# Patient Record
Sex: Female | Born: 1990 | Race: Black or African American | Hispanic: No | Marital: Single | Smoking: Never smoker
Health system: Southern US, Community
[De-identification: ages and names within clinical notes are randomized; demographics above are authoritative.]

## PROBLEM LIST (undated history)

## (undated) ENCOUNTER — Inpatient Hospital Stay (HOSPITAL_COMMUNITY): Payer: Self-pay

## (undated) DIAGNOSIS — D649 Anemia, unspecified: Secondary | ICD-10-CM

---
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
## 2015-06-12 LAB — OB RESULTS CONSOLE RUBELLA ANTIBODY, IGM: Rubella: IMMUNE

## 2015-06-12 LAB — OB RESULTS CONSOLE HIV ANTIBODY (ROUTINE TESTING): HIV: NONREACTIVE

## 2015-06-12 LAB — OB RESULTS CONSOLE HEPATITIS B SURFACE ANTIGEN: Hepatitis B Surface Ag: NEGATIVE

## 2015-06-23 ENCOUNTER — Inpatient Hospital Stay (HOSPITAL_COMMUNITY)
Admission: AD | Admit: 2015-06-23 | Discharge: 2015-06-24 | Disposition: A | Discharge status: Home / Self Care | Payer: Medicaid Other | Source: Ambulatory Visit | Attending: Obstetrics and Gynecology | Admitting: Obstetrics and Gynecology

## 2015-06-23 ENCOUNTER — Encounter (HOSPITAL_COMMUNITY): Payer: Self-pay | Admitting: *Deleted

## 2015-06-23 DIAGNOSIS — O99891 Other specified diseases and conditions complicating pregnancy: Secondary | ICD-10-CM

## 2015-06-23 DIAGNOSIS — O99611 Diseases of the digestive system complicating pregnancy, first trimester: Secondary | ICD-10-CM | POA: Diagnosis not present

## 2015-06-23 DIAGNOSIS — Z87891 Personal history of nicotine dependence: Secondary | ICD-10-CM | POA: Insufficient documentation

## 2015-06-23 DIAGNOSIS — M549 Dorsalgia, unspecified: Secondary | ICD-10-CM | POA: Diagnosis present

## 2015-06-23 DIAGNOSIS — K219 Gastro-esophageal reflux disease without esophagitis: Secondary | ICD-10-CM | POA: Diagnosis not present

## 2015-06-23 DIAGNOSIS — O26899 Other specified pregnancy related conditions, unspecified trimester: Secondary | ICD-10-CM

## 2015-06-23 DIAGNOSIS — R109 Unspecified abdominal pain: Secondary | ICD-10-CM | POA: Diagnosis not present

## 2015-06-23 DIAGNOSIS — O9989 Other specified diseases and conditions complicating pregnancy, childbirth and the puerperium: Secondary | ICD-10-CM

## 2015-06-23 DIAGNOSIS — Z3A13 13 weeks gestation of pregnancy: Secondary | ICD-10-CM | POA: Diagnosis not present

## 2015-06-23 LAB — CBC WITH DIFFERENTIAL/PLATELET
Basophils Absolute: 0 10*3/uL (ref 0.0–0.1)
Basophils Relative: 0 % (ref 0–1)
Eosinophils Absolute: 0.3 10*3/uL (ref 0.0–0.7)
Eosinophils Relative: 2 % (ref 0–5)
HCT: 33.5 % — ABNORMAL LOW (ref 36.0–46.0)
HEMOGLOBIN: 11.5 g/dL — AB (ref 12.0–15.0)
LYMPHS ABS: 2.1 10*3/uL (ref 0.7–4.0)
LYMPHS PCT: 16 % (ref 12–46)
MCH: 30.4 pg (ref 26.0–34.0)
MCHC: 34.3 g/dL (ref 30.0–36.0)
MCV: 88.6 fL (ref 78.0–100.0)
Monocytes Absolute: 0.7 10*3/uL (ref 0.1–1.0)
Monocytes Relative: 6 % (ref 3–12)
NEUTROS PCT: 76 % (ref 43–77)
Neutro Abs: 9.9 10*3/uL — ABNORMAL HIGH (ref 1.7–7.7)
PLATELETS: 312 10*3/uL (ref 150–400)
RBC: 3.78 MIL/uL — AB (ref 3.87–5.11)
RDW: 12.2 % (ref 11.5–15.5)
WBC: 13 10*3/uL — AB (ref 4.0–10.5)

## 2015-06-23 LAB — COMPREHENSIVE METABOLIC PANEL
ALT: 19 U/L (ref 14–54)
AST: 20 U/L (ref 15–41)
Albumin: 3.6 g/dL (ref 3.5–5.0)
Alkaline Phosphatase: 35 U/L — ABNORMAL LOW (ref 38–126)
Anion gap: 3 — ABNORMAL LOW (ref 5–15)
BILIRUBIN TOTAL: 0.1 mg/dL — AB (ref 0.3–1.2)
BUN: 8 mg/dL (ref 6–20)
CHLORIDE: 113 mmol/L — AB (ref 101–111)
CO2: 22 mmol/L (ref 22–32)
CREATININE: 0.53 mg/dL (ref 0.44–1.00)
Calcium: 8.8 mg/dL — ABNORMAL LOW (ref 8.9–10.3)
Glucose, Bld: 107 mg/dL — ABNORMAL HIGH (ref 65–99)
Potassium: 3.5 mmol/L (ref 3.5–5.1)
Sodium: 138 mmol/L (ref 135–145)
Total Protein: 6.7 g/dL (ref 6.5–8.1)

## 2015-06-23 LAB — URINALYSIS, ROUTINE W REFLEX MICROSCOPIC
BILIRUBIN URINE: NEGATIVE
Glucose, UA: NEGATIVE mg/dL
HGB URINE DIPSTICK: NEGATIVE
KETONES UR: NEGATIVE mg/dL
Leukocytes, UA: NEGATIVE
NITRITE: NEGATIVE
PH: 6 (ref 5.0–8.0)
Protein, ur: NEGATIVE mg/dL
Specific Gravity, Urine: 1.02 (ref 1.005–1.030)
Urobilinogen, UA: 0.2 mg/dL (ref 0.0–1.0)

## 2015-06-23 LAB — AMYLASE: Amylase: 63 U/L (ref 28–100)

## 2015-06-23 MED ORDER — IBUPROFEN 600 MG PO TABS
600.0000 mg | ORAL_TABLET | Freq: Once | ORAL | Status: AC
  Administered 2015-06-24: 600 mg via ORAL
  Filled 2015-06-23: qty 1

## 2015-06-23 MED ORDER — GI COCKTAIL ~~LOC~~
30.0000 mL | Freq: Once | ORAL | Status: AC
  Administered 2015-06-23: 30 mL via ORAL
  Filled 2015-06-23: qty 30

## 2015-06-23 NOTE — MAU Note (Signed)
PT SAYS UPPER ABD  AND  LEFT SIDE  PAIN STARTED  AT 930PM  .   AND  BACK PAIN STARTED  AT 940PM-   ALL WHILE IN BED.   WAS IN OFFICE - DR Chestine SporeLARK   ON 6-16.    HAD U/S  IN OFFICE. ..  LAST SEX-  2 SAT'S  AGO.

## 2015-06-23 NOTE — MAU Note (Signed)
Pt reports since 2130 she has has left lower abd, mid upper abd, and lower back pain. States it started suddenly and it makes her feel like she is going to vomit. Denies bleeding.

## 2015-06-23 NOTE — MAU Provider Note (Signed)
History     CSN: [id number]  Arrival date and time: 06/23/15 2209   First Provider Initiated Contact with Patient 06/23/15 2249      No chief complaint on file.  HPI  [name] is a 24 y.o. G1P0 at 3575w0d who presents to MAU today with complaint of abdominal and back pain. She states that pain started 1 hour prior to arrival. She had similar pain early last week that resolved spontaneously. She has not taken anything for pain today. She has had some nausea without vomiting, diarrhea or constipation. She denies vaginal bleeding, UTI symptoms, fever or vaginal discharge. She rates her mid to lower back pain at 8/10 now and her upper abdominal pain at 5/10 now. She states back pain is sharp and abdominal pain is cramping. She denies heartburn.   OB History    Gravida Para Term Preterm AB TAB SAB Ectopic Multiple Living   1               History reviewed. No pertinent past medical history.  History reviewed. No pertinent past surgical history.  History reviewed. No pertinent family history.  History  Substance Use Topics  . Smoking status: Former Games developermoker  . Smokeless tobacco: Not on file  . Alcohol Use: No    Allergies:  Allergies  Allergen Reactions  . Other Itching    Allergic  Cats-  reddness   And  swelling  . Watermelon [Citrullus Vulgaris] Itching    ALL MELONS    No prescriptions prior to admission    Review of Systems  Constitutional: Negative for fever and malaise/fatigue.  Gastrointestinal: Positive for abdominal pain. Negative for nausea, vomiting, diarrhea and constipation.  Genitourinary: Negative for dysuria, urgency and frequency.       Neg - vaginal bleeding, discharge  Musculoskeletal: Positive for back pain.   Physical Exam   Blood pressure 119/71, pulse 107, temperature 99.1 F (37.3 C), temperature source Oral, resp. rate 16, height 5\' 2"  (1.575 m), weight 158 lb (71.668 kg), SpO2 100 %.  Physical Exam  Nursing note and vitals  reviewed. Constitutional: She is oriented to person, place, and time. She appears well-developed and well-nourished. No distress.  HENT:  Head: Normocephalic and atraumatic.  Cardiovascular: Normal rate.   Respiratory: Effort normal.  GI: Soft. She exhibits no distension and no mass. There is tenderness (tenderness to palpation of the upper abdomen bilaterally). There is no rebound, no guarding and no CVA tenderness.  Musculoskeletal:       Thoracic back: She exhibits tenderness. She exhibits normal range of motion, no bony tenderness, no swelling, no pain and no spasm.       Lumbar back: She exhibits tenderness. She exhibits no bony tenderness, no swelling, no pain and no spasm.  Neurological: She is alert and oriented to person, place, and time.  Skin: Skin is warm and dry. No erythema.  Psychiatric: She has a normal mood and affect.   Results for orders placed or performed during the hospital encounter of 06/23/15 (from the past 24 hour(s))  Urinalysis, Routine w reflex microscopic ([facility])     Status: Abnormal   Collection Time: 06/23/15 10:50 PM  Result Value Ref Range   Color, Urine YELLOW YELLOW   APPearance CLOUDY (A) CLEAR   Specific Gravity, Urine 1.020 1.005 - 1.030   pH 6.0 5.0 - 8.0   Glucose, UA NEGATIVE NEGATIVE mg/dL   Hgb urine dipstick NEGATIVE NEGATIVE   Bilirubin Urine NEGATIVE NEGATIVE  Ketones, ur NEGATIVE NEGATIVE mg/dL   Protein, ur NEGATIVE NEGATIVE mg/dL   Urobilinogen, UA 0.2 0.0 - 1.0 mg/dL   Nitrite NEGATIVE NEGATIVE   Leukocytes, UA NEGATIVE NEGATIVE  CBC with Differential/Platelet     Status: Abnormal   Collection Time: 06/23/15 11:09 PM  Result Value Ref Range   WBC 13.0 (H) 4.0 - 10.5 K/uL   RBC 3.78 (L) 3.87 - 5.11 MIL/uL   Hemoglobin 11.5 (L) 12.0 - 15.0 g/dL   HCT 16.1 (L) 09.6 - 04.5 %   MCV 88.6 78.0 - 100.0 fL   MCH 30.4 26.0 - 34.0 pg   MCHC 34.3 30.0 - 36.0 g/dL   RDW 40.9 81.1 - 91.4 %   Platelets 312 150 - 400 K/uL    Neutrophils Relative % 76 43 - 77 %   Neutro Abs 9.9 (H) 1.7 - 7.7 K/uL   Lymphocytes Relative 16 12 - 46 %   Lymphs Abs 2.1 0.7 - 4.0 K/uL   Monocytes Relative 6 3 - 12 %   Monocytes Absolute 0.7 0.1 - 1.0 K/uL   Eosinophils Relative 2 0 - 5 %   Eosinophils Absolute 0.3 0.0 - 0.7 K/uL   Basophils Relative 0 0 - 1 %   Basophils Absolute 0.0 0.0 - 0.1 K/uL  Comprehensive metabolic panel     Status: Abnormal   Collection Time: 06/23/15 11:09 PM  Result Value Ref Range   Sodium 138 135 - 145 mmol/L   Potassium 3.5 3.5 - 5.1 mmol/L   Chloride 113 (H) 101 - 111 mmol/L   CO2 22 22 - 32 mmol/L   Glucose, Bld 107 (H) 65 - 99 mg/dL   BUN 8 6 - 20 mg/dL   Creatinine, Ser 7.82 0.44 - 1.00 mg/dL   Calcium 8.8 (L) 8.9 - 10.3 mg/dL   Total Protein 6.7 6.5 - 8.1 g/dL   Albumin 3.6 3.5 - 5.0 g/dL   AST 20 15 - 41 U/L   ALT 19 14 - 54 U/L   Alkaline Phosphatase 35 (L) 38 - 126 U/L   Total Bilirubin 0.1 (L) 0.3 - 1.2 mg/dL   GFR calc non Af Amer >60 >60 mL/min   GFR calc Af Amer >60 >60 mL/min   Anion gap 3 (L) 5 - 15  Amylase     Status: None   Collection Time: 06/23/15 11:09 PM  Result Value Ref Range   Amylase 63 28 - 100 U/L    MAU Course  Procedures None  MDM FHR - 168 bpm with doppler CBC, CMP, Amylase and GI cocktail today Discussed with Dr. Tenny Craw. Advised patient could have ibuprofen here for MSK pain. Also, Prilosec at home for avoid recurrence of pain.  600 mg Ibuprofen given in MAU  Assessment and Plan  A: SIUP at [redacted]w[redacted]d GERD Back pain in pregnancy  P: Discharge home Rx for Prilosec given to patient Diet for heartburn in pregnancy discussed Patient advised to follow-up with [facility] as scheduled for routine prenatal care Patient may return to MAU as needed or if her condition were to change or worsen   Marny Lowenstein, PA-C  06/24/2015, 12:09 AM

## 2015-06-24 DIAGNOSIS — R109 Unspecified abdominal pain: Secondary | ICD-10-CM | POA: Diagnosis not present

## 2015-06-24 DIAGNOSIS — O9989 Other specified diseases and conditions complicating pregnancy, childbirth and the puerperium: Secondary | ICD-10-CM

## 2015-06-24 MED ORDER — OMEPRAZOLE 20 MG PO CPDR: 20.0000 mg | DELAYED_RELEASE_CAPSULE | Freq: Every day | ORAL | Status: DC

## 2015-06-24 NOTE — Discharge Instructions (Signed)
Food Choices for Gastroesophageal Reflux Disease When you have gastroesophageal reflux disease (GERD), the foods you eat and your eating habits are very important. Choosing the right foods can help ease your discomfort.  WHAT GUIDELINES DO I NEED TO FOLLOW?   Choose fruits, vegetables, whole grains, and low-fat dairy products.   Choose low-fat meat, fish, and poultry.  Limit fats such as oils, salad dressings, butter, nuts, and avocado.   Keep a food diary. This helps you identify foods that cause symptoms.   Avoid foods that cause symptoms. These may be different for everyone.   Eat small meals often instead of 3 large meals a day.   Eat your meals slowly, in a place where you are relaxed.   Limit fried foods.   Cook foods using methods other than frying.   Avoid drinking alcohol.   Avoid drinking large amounts of liquids with your meals.   Avoid bending over or lying down until 2-3 hours after eating.  WHAT FOODS ARE NOT RECOMMENDED?  These are some foods and drinks that may make your symptoms worse: Vegetables Tomatoes. Tomato juice. Tomato and spaghetti sauce. Chili peppers. Onion and garlic. Horseradish. Fruits Oranges, grapefruit, and lemon (fruit and juice). Meats High-fat meats, fish, and poultry. This includes hot dogs, ribs, ham, sausage, salami, and bacon. Dairy Whole milk and chocolate milk. Sour cream. Cream. Butter. Ice cream. Cream cheese.  Drinks Coffee and tea. Bubbly (carbonated) drinks or energy drinks. Condiments Hot sauce. Barbecue sauce.  Sweets/Desserts Chocolate and cocoa. Donuts. Peppermint and spearmint. Fats and Oils High-fat foods. This includes [place] fries and potato chips. Other Vinegar. Strong spices. This includes black pepper, white pepper, red pepper, cayenne, curry powder, cloves, ginger, and chili powder. The items listed above may not be a complete list of foods and drinks to avoid. Contact your dietitian for more  information. Document Released: 06/13/2012 Document Revised: 12/18/2013 Document Reviewed: 10/17/2013 [facility] Patient Information 2015 [place], [place]. This information is not intended to replace advice given to you by your health care provider. Make sure you discuss any questions you have with your health care provider. Back Pain in Pregnancy Back pain during pregnancy is common. It happens in about half of all pregnancies. It is important for you and your baby that you remain active during your pregnancy.If you feel that back pain is not allowing you to remain active or sleep well, it is time to see your caregiver. Back pain may be caused by several factors related to changes during your pregnancy.Fortunately, unless you had trouble with your back before your pregnancy, the pain is likely to get better after you deliver. Low back pain usually occurs between the fifth and seventh months of pregnancy. It can, however, happen in the first couple months. Factors that increase the risk of back problems include:   Previous back problems.  Injury to your back.  Having twins or multiple births.  A chronic cough.  Stress.  Job-related repetitive motions.  Muscle or spinal disease in the back.  Family history of back problems, ruptured (herniated) discs, or osteoporosis.  Depression, anxiety, and panic attacks. CAUSES   When you are pregnant, your body produces a hormone called relaxin. This hormonemakes the ligaments connecting the low back and pubic bones more flexible. This flexibility allows the baby to be delivered more easily. When your ligaments are loose, your muscles need to work harder to support your back. Soreness in your back can come from tired muscles. Soreness can also come from  back tissues that are irritated since they are receiving less support.  As the baby grows, it puts pressure on the nerves and blood vessels in your pelvis. This can cause back pain.  As the baby  grows and gets heavier during pregnancy, the uterus pushes the stomach muscles forward and changes your center of gravity. This makes your back muscles work harder to maintain good posture. SYMPTOMS  Lumbar pain during pregnancy Lumbar pain during pregnancy usually occurs at or above the waist in the center of the back. There may be pain and numbness that radiates into your leg or foot. This is similar to low back pain experienced by non-pregnant women. It usually increases with sitting for long periods of time, standing, or repetitive lifting. Tenderness may also be present in the muscles along your upper back. Posterior pelvic pain during pregnancy Pain in the back of the pelvis is more common than lumbar pain in pregnancy. It is a deep pain felt in your side at the waistline, or across the tailbone (sacrum), or in both places. You may have pain on one or both sides. This pain can also go into the buttocks and backs of the upper thighs. Pubic and groin pain may also be present. The pain does not quickly resolve with rest, and morning stiffness may also be present. Pelvic pain during pregnancy can be brought on by most activities. A high level of fitness before and during pregnancy may or may not prevent this problem. Labor pain is usually 1 to 2 minutes apart, lasts for about 1 minute, and involves a bearing down feeling or pressure in your pelvis. However, if you are at term with the pregnancy, constant low back pain can be the beginning of early labor, and you should be aware of this. DIAGNOSIS  X-rays of the back should not be done during the first 12 to 14 weeks of the pregnancy and only when absolutely necessary during the rest of the pregnancy. MRIs do not give off radiation and are safe during pregnancy. MRIs also should only be done when absolutely necessary. HOME CARE INSTRUCTIONS  Exercise as directed by your caregiver. Exercise is the most effective way to prevent or manage back pain. If you  have a back problem, it is especially important to avoid sports that require sudden body movements. Swimming and walking are great activities.  Do not stand in one place for long periods of time.  Do not wear high heels.  Sit in chairs with good posture. Use a pillow on your lower back if necessary. Make sure your head rests over your shoulders and is not hanging forward.  Try sleeping on your side, preferably the left side, with a pillow or two between your legs. If you are sore after a night's rest, your bedmay betoo soft.Try placing a board between your mattress and box spring.  Listen to your body when lifting.If you are experiencing pain, ask for help or try bending yourknees more so you can use your leg muscles rather than your back muscles. Squat down when picking up something from the floor. Do not bend over.  Eat a healthy diet. Try to gain weight within your caregiver's recommendations.  Use heat or cold packs 3 to 4 times a day for 15 minutes to help with the pain.  Only take over-the-counter or prescription medicines for pain, discomfort, or fever as directed by your caregiver. Sudden (acute) back pain  Use bed rest for only the most extreme, acute episodes  of back pain. Prolonged bed rest over 48 hours will aggravate your condition.  Ice is very effective for acute conditions.  Put ice in a plastic bag.  Place a towel between your skin and the bag.  Leave the ice on for 10 to 20 minutes every 2 hours, or as needed.  Using heat packs for 30 minutes prior to activities is also helpful. Continued back pain See your caregiver if you have continued problems. Your caregiver can help or refer you for appropriate physical therapy. With conditioning, most back problems can be avoided. Sometimes, a more serious issue may be the cause of back pain. You should be seen right away if new problems seem to be developing. Your caregiver may recommend:  A maternity girdle.  An  elastic sling.  A back brace.  A massage therapist or acupuncture. SEEK MEDICAL CARE IF:   You are not able to do most of your daily activities, even when taking the pain medicine you were given.  You need a referral to a physical therapist or chiropractor.  You want to try acupuncture. SEEK IMMEDIATE MEDICAL CARE IF:  You develop numbness, tingling, weakness, or problems with the use of your arms or legs.  You develop severe back pain that is no longer relieved with medicines.  You have a sudden change in bowel or bladder control.  You have increasing pain in other areas of the body.  You develop shortness of breath, dizziness, or fainting.  You develop nausea, vomiting, or sweating.  You have back pain which is similar to labor pains.  You have back pain along with your water breaking or vaginal bleeding.  You have back pain or numbness that travels down your leg.  Your back pain developed after you fell.  You develop pain on one side of your back. You may have a kidney stone.  You see blood in your urine. You may have a bladder infection or kidney stone.  You have back pain with blisters. You may have shingles. Back pain is fairly common during pregnancy but should not be accepted as just part of the process. Back pain should always be treated as soon as possible. This will make your pregnancy as pleasant as possible. Document Released: 03/23/2006 Document Revised: 03/06/2012 Document Reviewed: 05/04/2011 [facility] Patient Information 2015 [place]. This information is not intended to replace advice given to you by your health care provider. Make sure you discuss any questions you have with your health care provider.

## 2015-07-15 ENCOUNTER — Inpatient Hospital Stay (HOSPITAL_COMMUNITY)
Admission: AD | Admit: 2015-07-15 | Discharge: 2015-07-15 | Disposition: A | Discharge status: Home / Self Care | Payer: Medicaid Other | Source: Ambulatory Visit | Attending: Obstetrics and Gynecology | Admitting: Obstetrics and Gynecology

## 2015-07-15 ENCOUNTER — Encounter (HOSPITAL_COMMUNITY): Payer: Self-pay | Admitting: *Deleted

## 2015-07-15 DIAGNOSIS — S3991XA Unspecified injury of abdomen, initial encounter: Secondary | ICD-10-CM

## 2015-07-15 DIAGNOSIS — Z3A16 16 weeks gestation of pregnancy: Secondary | ICD-10-CM

## 2015-07-15 DIAGNOSIS — X58XXXA Exposure to other specified factors, initial encounter: Secondary | ICD-10-CM | POA: Diagnosis not present

## 2015-07-15 DIAGNOSIS — O9A212 Injury, poisoning and certain other consequences of external causes complicating pregnancy, second trimester: Secondary | ICD-10-CM | POA: Diagnosis not present

## 2015-07-15 NOTE — Discharge Instructions (Signed)
What Do I Need to Know About Injuries During Pregnancy? °Injuries can happen during pregnancy. Minor falls and accidents usually do not harm you or your baby. However, any injury should be reported to your doctor. °WHAT CAN I DO TO PROTECT MYSELF FROM INJURIES? °· Remove rugs and loose objects on the floor. °· Wear comfortable shoes that have a good grip. Do not wear high-heeled shoes. °· Always wear your seat belt. The lap belt should be below your belly. Always practice safe driving. °· Do not ride on a motorcycle. °· Do not participate in high-impact activities or sports. °· Avoid: °¨ Walking on wet or slippery floors. °¨ Fires. °¨ Starting fires. °¨ Lifting heavy pots of boiling or hot liquids. °¨ Fixing electrical problems. °· Only take medicine as told by your doctor. °· Know your blood type and the blood type of the baby's father. °· Call your local emergency services (911 in the [place] if you are a victim of domestic violence or assault. For help and support, contact the National Domestic Violence Hotline. °WHEN SHOULD I GET HELP RIGHT AWAY? °· You fall on your belly or have any high-impact accident or injury. °· You have been a victim of domestic violence or any kind of violence. °· You have been in a car accident. °· You have bleeding from your vagina. °· Fluid is leaking from your vagina. °· You start to have belly cramping (contractions) or pain. °· You feel weak or pass out (faint). °· You start to throw up (vomit) after an injury. °· You have been burned. °· You have a stiff neck or neck pain. °· You get a headache or have vision problems after an injury. °· You do not feel the baby move or the baby is not moving as much as normal. °Document Released: 01/15/2011 Document Revised: 04/29/2014 Document Reviewed: 09/19/2013 °[employer]® Patient Information ©2015 [employer]. This information is not intended to replace advice given to you by your health care provider. Make sure you discuss any questions you  have with your health care provider. ° °

## 2015-07-15 NOTE — MAU Provider Note (Signed)
  History     CSN: [id number]  Arrival date and time: 07/15/15 2000   First Provider Initiated Contact with Patient 07/15/15 2027      No chief complaint on file.  HPI  [name] is a 24 y.o. G1P0 at 4261w1d who presents to MAU today with complaint of abdominal trauma. The patient states that she was at work and was accidentally elbowed in the upper abdomen just above the umbilicus. She states pain is rated at 6/10 now. She has not taken anything for pain. She denies redness or bruising to the area. She denies vaginal bleeding.  OB History    Gravida Para Term Preterm AB TAB SAB Ectopic Multiple Living   1               History reviewed. No pertinent past medical history.  History reviewed. No pertinent past surgical history.  History reviewed. No pertinent family history.  History  Substance Use Topics  . Smoking status: Former Games developermoker  . Smokeless tobacco: Not on file  . Alcohol Use: No    Allergies:  Allergies  Allergen Reactions  . Other Itching and Swelling    Allergic  Cats-  reddness    . Watermelon [Citrullus Vulgaris] Itching    ALL MELONS    Prescriptions prior to admission  Medication Sig Dispense Refill Last Dose  . Prenatal Vit-Fe Fumarate-FA (PRENATAL MULTIVITAMIN) TABS tablet Take 1 tablet by mouth daily at 12 noon.   07/14/2015 at Unknown time  . omeprazole (PRILOSEC) 20 MG capsule Take 1 capsule (20 mg total) by mouth daily. (Patient not taking: Reported on 07/15/2015) 30 capsule 2 Not Taking at Unknown time    Review of Systems  Gastrointestinal: Positive for abdominal pain.  Genitourinary:       Neg - vaginal bleeding  Skin:       Neg - ecchymosis    Physical Exam   Blood pressure 121/78, pulse 85, temperature 98.4 F (36.9 C), temperature source Oral, resp. rate 16, height 5' 2.4" (1.585 m), weight 161 lb 9.6 oz (73.301 kg), SpO2 100 %.  Physical Exam  Nursing note and vitals reviewed. Constitutional: She is oriented to person,  place, and time. She appears well-developed and well-nourished. No distress.  HENT:  Head: Normocephalic and atraumatic.  Cardiovascular: Normal rate.   Respiratory: Effort normal.  GI: Soft. She exhibits no distension and no mass. There is tenderness (mild tenderness to palpation just above the umbilicus). There is no rebound and no guarding.  Neurological: She is alert and oriented to person, place, and time.  Skin: Skin is warm and dry. No rash noted. No erythema.  Neg - ecchymosis  Psychiatric: She has a normal mood and affect.   MAU Course  Procedures None  MDM FHR - 170 bpm with doppler Discussed patient with Dr. Henderson CloudHorvath. She agrees with plan for discharge at this time, Tylenol, ice and rest.  Assessment and Plan  A: Abdominal trauma in pregnancy  P: Discharge home Tylenol advised PRN for pain Ice to affected area and muscle rest Warning signs for worsening condition discussed Patient [facility] for routine prenatal care or sooner PRN Patient may return to MAU as needed or if her condition were to change or worsen  Marny LowensteinJulie N Wenzel, PA-C  07/15/2015, 8:27 PM

## 2015-07-15 NOTE — MAU Note (Signed)
Pt states that she was at work and someone accidentally elbowed her in the stomach around 1830. Having sharp pain around umbilicus that is constant and sometimes radiates to pelvic area. Denies vaginal bleeding. Denies urinary s/s. Did not take anything for the pain.

## 2015-10-08 LAB — OB RESULTS CONSOLE RPR: RPR: NONREACTIVE

## 2015-12-03 LAB — OB RESULTS CONSOLE GC/CHLAMYDIA
Chlamydia: NEGATIVE
GC PROBE AMP, GENITAL: NEGATIVE

## 2015-12-28 ENCOUNTER — Encounter (HOSPITAL_COMMUNITY): Payer: Self-pay | Admitting: *Deleted

## 2015-12-28 ENCOUNTER — Inpatient Hospital Stay (HOSPITAL_COMMUNITY)
Admission: AD | Admit: 2015-12-28 | Discharge: 2015-12-28 | Disposition: A | Discharge status: Home / Self Care | Payer: Medicaid Other | Source: Ambulatory Visit | Attending: Obstetrics | Admitting: Obstetrics

## 2015-12-28 DIAGNOSIS — Z3403 Encounter for supervision of normal first pregnancy, third trimester: Secondary | ICD-10-CM | POA: Diagnosis present

## 2015-12-28 NOTE — MAU Note (Addendum)
Contractions since 2230 on Sat. Denies LOF or bleeding. 3cm last sve

## 2015-12-28 NOTE — L&D Delivery Note (Signed)
Delivery Note At 8:44 PM a viable and healthy female was delivered via  (Presentation: left occiput ; anterior  ).  APGAR: 7, 9; weight pending .   Placenta status: spontaneous , intact .  Cord: 3V with no complications  After delivery of the infant the uterus was boggy. This resolved with uterine massage and IM methergine. The patient's bleeding was actually not significant while the uterus was boggy.   Anesthesia: Epidural  Episiotomy:  none Lacerations:  none Suture Repair: none Est. Blood Loss (mL):  200 cc   Mom to postpartum.  Baby to Couplet care / Skin to Skin.  Jeb Schloemer H. 01/02/2016, 9:00 PM

## 2015-12-28 NOTE — MAU Note (Signed)
PT SAYS  SHE HURTS  BAD   SINCE  1030PM.     VE IN OFFICE   ON Tuesday   2-3  CM.     DENIES HSV AND MRSA.  GBS-   NEG

## 2016-01-02 ENCOUNTER — Inpatient Hospital Stay (HOSPITAL_COMMUNITY): Payer: Medicaid Other | Admitting: Anesthesiology

## 2016-01-02 ENCOUNTER — Encounter (HOSPITAL_COMMUNITY): Payer: Self-pay | Admitting: *Deleted

## 2016-01-02 ENCOUNTER — Inpatient Hospital Stay (HOSPITAL_COMMUNITY)
Admission: AD | Admit: 2016-01-02 | Discharge: 2016-01-04 | DRG: 775 | Disposition: A | Discharge status: Home / Self Care | Payer: Medicaid Other | Source: Ambulatory Visit | Attending: Obstetrics and Gynecology | Admitting: Obstetrics and Gynecology

## 2016-01-02 ENCOUNTER — Inpatient Hospital Stay (HOSPITAL_COMMUNITY)
Admission: AD | Admit: 2016-01-02 | Discharge: 2016-01-02 | Disposition: A | Discharge status: Home / Self Care | Payer: Medicaid Other | Source: Ambulatory Visit | Attending: Obstetrics | Admitting: Obstetrics

## 2016-01-02 ENCOUNTER — Encounter (HOSPITAL_COMMUNITY): Payer: Self-pay

## 2016-01-02 DIAGNOSIS — Z3A40 40 weeks gestation of pregnancy: Secondary | ICD-10-CM

## 2016-01-02 DIAGNOSIS — IMO0001 Reserved for inherently not codable concepts without codable children: Secondary | ICD-10-CM

## 2016-01-02 LAB — TYPE AND SCREEN
ABO/RH(D): O POS
ANTIBODY SCREEN: NEGATIVE

## 2016-01-02 LAB — OB RESULTS CONSOLE GBS: STREP GROUP B AG: NEGATIVE

## 2016-01-02 LAB — CBC
HEMATOCRIT: 33.1 % — AB (ref 36.0–46.0)
HEMOGLOBIN: 10.9 g/dL — AB (ref 12.0–15.0)
MCH: 28.1 pg (ref 26.0–34.0)
MCHC: 32.9 g/dL (ref 30.0–36.0)
MCV: 85.3 fL (ref 78.0–100.0)
Platelets: 332 10*3/uL (ref 150–400)
RBC: 3.88 MIL/uL (ref 3.87–5.11)
RDW: 13.4 % (ref 11.5–15.5)
WBC: 18 10*3/uL — ABNORMAL HIGH (ref 4.0–10.5)

## 2016-01-02 LAB — ABO/RH: ABO/RH(D): O POS

## 2016-01-02 MED ORDER — OXYTOCIN 10 UNIT/ML IJ SOLN
2.5000 [IU]/h | INTRAVENOUS | Status: DC
  Filled 2016-01-02: qty 4

## 2016-01-02 MED ORDER — ACETAMINOPHEN 325 MG PO TABS: 650.0000 mg | ORAL_TABLET | ORAL | Status: DC | PRN

## 2016-01-02 MED ORDER — METHYLERGONOVINE MALEATE 0.2 MG PO TABS: 0.2000 mg | ORAL_TABLET | ORAL | Status: DC | PRN

## 2016-01-02 MED ORDER — ONDANSETRON HCL 4 MG PO TABS: 4.0000 mg | ORAL_TABLET | ORAL | Status: DC | PRN

## 2016-01-02 MED ORDER — DIPHENHYDRAMINE HCL 25 MG PO CAPS: 25.0000 mg | ORAL_CAPSULE | Freq: Four times a day (QID) | ORAL | Status: DC | PRN

## 2016-01-02 MED ORDER — BENZOCAINE-MENTHOL 20-0.5 % EX AERO
1.0000 "application " | INHALATION_SPRAY | CUTANEOUS | Status: DC | PRN
  Filled 2016-01-02: qty 56

## 2016-01-02 MED ORDER — METHYLERGONOVINE MALEATE 0.2 MG/ML IJ SOLN
INTRAMUSCULAR | Status: AC
  Administered 2016-01-02: 0.2 mg
  Filled 2016-01-02: qty 1

## 2016-01-02 MED ORDER — ONDANSETRON HCL 4 MG/2ML IJ SOLN
4.0000 mg | Freq: Four times a day (QID) | INTRAMUSCULAR | Status: DC | PRN
  Administered 2016-01-02: 4 mg via INTRAVENOUS
  Filled 2016-01-02: qty 2

## 2016-01-02 MED ORDER — OXYCODONE-ACETAMINOPHEN 5-325 MG PO TABS
1.0000 | ORAL_TABLET | ORAL | Status: DC | PRN
Start: 2016-01-02 — End: 2016-01-02

## 2016-01-02 MED ORDER — BUTORPHANOL TARTRATE 1 MG/ML IJ SOLN: 1.0000 mg | INTRAMUSCULAR | Status: DC | PRN

## 2016-01-02 MED ORDER — FENTANYL 2.5 MCG/ML BUPIVACAINE 1/10 % EPIDURAL INFUSION (WH - ANES)
14.0000 mL/h | INTRAMUSCULAR | Status: DC | PRN
  Administered 2016-01-02 (×2): 14 mL/h via EPIDURAL
  Filled 2016-01-02 (×2): qty 125

## 2016-01-02 MED ORDER — OXYCODONE-ACETAMINOPHEN 5-325 MG PO TABS: 2.0000 | ORAL_TABLET | ORAL | Status: DC | PRN

## 2016-01-02 MED ORDER — LACTATED RINGERS IV SOLN: 500.0000 mL | INTRAVENOUS | Status: DC | PRN

## 2016-01-02 MED ORDER — SENNOSIDES-DOCUSATE SODIUM 8.6-50 MG PO TABS
2.0000 | ORAL_TABLET | ORAL | Status: DC
  Administered 2016-01-03 (×2): 2 via ORAL
  Filled 2016-01-02 (×2): qty 2

## 2016-01-02 MED ORDER — LIDOCAINE HCL (PF) 1 % IJ SOLN
30.0000 mL | INTRAMUSCULAR | Status: DC | PRN
Start: 2016-01-02 — End: 2016-01-02
  Filled 2016-01-02: qty 30

## 2016-01-02 MED ORDER — PRENATAL MULTIVITAMIN CH
1.0000 | ORAL_TABLET | Freq: Every day | ORAL | Status: DC
  Administered 2016-01-03 – 2016-01-04 (×2): 1 via ORAL
  Filled 2016-01-02 (×2): qty 1

## 2016-01-02 MED ORDER — LIDOCAINE HCL (PF) 1 % IJ SOLN
INTRAMUSCULAR | Status: DC | PRN
  Administered 2016-01-02: 6 mL via EPIDURAL
  Administered 2016-01-02: 4 mL

## 2016-01-02 MED ORDER — ZOLPIDEM TARTRATE 5 MG PO TABS: 5.0000 mg | ORAL_TABLET | Freq: Every evening | ORAL | Status: DC | PRN

## 2016-01-02 MED ORDER — DIPHENHYDRAMINE HCL 50 MG/ML IJ SOLN: 12.5000 mg | INTRAMUSCULAR | Status: DC | PRN

## 2016-01-02 MED ORDER — OXYTOCIN BOLUS FROM INFUSION: 500.0000 mL | INTRAVENOUS | Status: DC

## 2016-01-02 MED ORDER — SIMETHICONE 80 MG PO CHEW: 80.0000 mg | CHEWABLE_TABLET | ORAL | Status: DC | PRN

## 2016-01-02 MED ORDER — ONDANSETRON HCL 4 MG/2ML IJ SOLN: 4.0000 mg | INTRAMUSCULAR | Status: DC | PRN

## 2016-01-02 MED ORDER — CITRIC ACID-SODIUM CITRATE 334-500 MG/5ML PO SOLN: 30.0000 mL | ORAL | Status: DC | PRN

## 2016-01-02 MED ORDER — LACTATED RINGERS IV SOLN
INTRAVENOUS | Status: DC
  Administered 2016-01-02 (×2): via INTRAVENOUS

## 2016-01-02 MED ORDER — WITCH HAZEL-GLYCERIN EX PADS: 1.0000 "application " | MEDICATED_PAD | CUTANEOUS | Status: DC | PRN

## 2016-01-02 MED ORDER — LANOLIN HYDROUS EX OINT: TOPICAL_OINTMENT | CUTANEOUS | Status: DC | PRN

## 2016-01-02 MED ORDER — TETANUS-DIPHTH-ACELL PERTUSSIS 5-2.5-18.5 LF-MCG/0.5 IM SUSP
0.5000 mL | Freq: Once | INTRAMUSCULAR | Status: DC
  Filled 2016-01-02: qty 0.5

## 2016-01-02 MED ORDER — ACETAMINOPHEN 325 MG PO TABS
650.0000 mg | ORAL_TABLET | ORAL | Status: DC | PRN
  Administered 2016-01-03 – 2016-01-04 (×3): 650 mg via ORAL
  Filled 2016-01-02 (×3): qty 2

## 2016-01-02 MED ORDER — METHYLERGONOVINE MALEATE 0.2 MG/ML IJ SOLN: 0.2000 mg | INTRAMUSCULAR | Status: DC | PRN

## 2016-01-02 MED ORDER — EPHEDRINE 5 MG/ML INJ: 10.0000 mg | INTRAVENOUS | Status: DC | PRN

## 2016-01-02 MED ORDER — PHENYLEPHRINE 40 MCG/ML (10ML) SYRINGE FOR IV PUSH (FOR BLOOD PRESSURE SUPPORT)
80.0000 ug | PREFILLED_SYRINGE | INTRAVENOUS | Status: DC | PRN
  Filled 2016-01-02: qty 20

## 2016-01-02 MED ORDER — IBUPROFEN 600 MG PO TABS
600.0000 mg | ORAL_TABLET | Freq: Four times a day (QID) | ORAL | Status: DC
Start: 2016-01-03 — End: 2016-01-04
  Administered 2016-01-03 – 2016-01-04 (×7): 600 mg via ORAL
  Filled 2016-01-02 (×7): qty 1

## 2016-01-02 MED ORDER — DIBUCAINE 1 % RE OINT
1.0000 "application " | TOPICAL_OINTMENT | RECTAL | Status: DC | PRN
  Filled 2016-01-02: qty 28

## 2016-01-02 NOTE — Anesthesia Preprocedure Evaluation (Signed)

## 2016-01-02 NOTE — Anesthesia Procedure Notes (Signed)

## 2016-01-03 LAB — CBC
HCT: 31.2 % — ABNORMAL LOW (ref 36.0–46.0)
Hemoglobin: 10.2 g/dL — ABNORMAL LOW (ref 12.0–15.0)
MCH: 28.2 pg (ref 26.0–34.0)
MCHC: 32.7 g/dL (ref 30.0–36.0)
MCV: 86.2 fL (ref 78.0–100.0)
PLATELETS: 300 10*3/uL (ref 150–400)
RBC: 3.62 MIL/uL — ABNORMAL LOW (ref 3.87–5.11)
RDW: 13.5 % (ref 11.5–15.5)
WBC: 24.8 10*3/uL — ABNORMAL HIGH (ref 4.0–10.5)

## 2016-01-03 LAB — RPR: RPR: NONREACTIVE

## 2016-01-03 NOTE — Lactation Note (Signed)
This note was copied from the chart of Sherri [place] Young. Lactation Consultation Note  Patient Name: Sherri Suszanne FinchBrittany Young [id number]'s Date: 01/03/2016 Reason for consult: Follow-up assessment Infant is 2118 hours old and seen for latch assistance. Nurse called LC because mom was ready to BF. When LC entered, mom had baby on left breast in cradle hold. Encouraged mom to use her right hand to support the infant behind his head while BF. Lips were flanged & baby was suckling but no swallows were noted. Mom reported no pain. Baby was on right breast for ~20 mins. When baby came off her nipple was slightly slanted. Taught mom how to hand express & mom was able to get drops right away. Mom used to have her nipples pierces so when she did hand expression on her right breast, milk came out of the old piercing hole. Mom's right nipple before latching was shorter but baby has no problem with maintaining latch & suckling while at the breast - will need to monitor at future feeds to see if more intervention is needed. Mom tried to burp him & then we tried football hold on right breast. Baby latched on again right away & was suckling but again, no swallows. Encouraged mom to continue to listen/ watch for swallows at future feeds. Encouraged mom to BF on cue at least 8-12 times in 24hrs. Reviewed BF pages in Baby & Me booklet; discussed BF positions, latch tips, prevention & treatment of sore nipples and engorgement. Mom reported no other ?s at this time.   Maternal Data Has patient been taught Hand Expression?: Yes (drops seen right away)  Feeding Feeding Type: Breast Fed Length of feed: 25 min  LATCH Score/Interventions Latch: Grasps breast easily, tongue down, lips flanged, rhythmical sucking. Intervention(s): Adjust position;Breast compression  Audible Swallowing: None Intervention(s): Skin to skin;Hand expression Intervention(s): Alternate breast massage  Type of Nipple: Flat (come out a little after  BF)  Comfort (Breast/Nipple): Soft / non-tender     Hold (Positioning): Assistance needed to correctly position infant at breast and maintain latch. Intervention(s): Breastfeeding basics reviewed;Support Pillows;Position options;Skin to skin  LATCH Score: 6  Lactation Tools Discussed/Used WIC Program: No Pump Review: Setup, frequency, and cleaning   Consult Status Consult Status: Follow-up Date: 01/04/16 Follow-up type: In-patient    [name] 01/03/2016, 3:57 PM

## 2016-01-03 NOTE — Anesthesia Postprocedure Evaluation (Signed)
Anesthesia Post Note  Patient: Sherri Young  Procedure(s) Performed: * No procedures listed *  Patient location during evaluation: Mother Baby Anesthesia Type: Epidural Level of consciousness: awake and alert, patient cooperative and oriented Pain management: pain level controlled Vital Signs Assessment: post-procedure vital signs reviewed and stable Respiratory status: spontaneous breathing Cardiovascular status: stable Postop Assessment: no headache, no backache, patient able to bend at knees, no signs of nausea or vomiting and adequate PO intake Anesthetic complications: no    Last Vitals:  Filed Vitals:   01/03/16 1135 01/03/16 1740  BP: 123/69 106/54  Pulse: 95 78  Temp: 37.1 C 36.9 C  Resp: 18 18    Last Pain:  Filed Vitals:   01/03/16 2008  PainSc: 0-No pain                 Rosalia Hammersollins, Paylin Hailu S

## 2016-01-03 NOTE — Progress Notes (Signed)
Post Partum Day 1 Subjective: no complaints, up ad lib, voiding and tolerating PO  Objective: Blood pressure 111/60, pulse 79, temperature 98.3 F (36.8 C), temperature source Oral, resp. rate 18, height 5\' 2"  (1.575 m), weight 79.379 kg (175 lb), SpO2 100 %, unknown if currently breastfeeding.  Physical Exam:  General: alert, cooperative and appears stated age Lochia: appropriate Uterine Fundus: firm   Recent Labs  01/02/16 1300 01/03/16 0550  HGB 10.9* 10.2*  HCT 33.1* 31.2*    Assessment/Plan: Plan for discharge tomorrow and Breastfeeding   LOS: 1 day   Sirena Riddle H. 01/03/2016, 9:56 AM

## 2016-01-03 NOTE — Lactation Note (Signed)
This note was copied from the chart of Sherri Young. Lactation Consultation Note  Patient Name: Sherri Young Today's Date: 01/03/2016 Reason for consult: Initial assessment Infant is 17 hours old and seen by lactation for initial assessment. (LC went in earlier but mom was in the bathroom.) Baby was asleep when LC entered. Mom has been BF for 10-15 mins each time on one breast per feeding & reports no problems. This is mom's first baby. Provided mom with BF booklet, BF resources, & feeding logs; discussed outpatient services, support groups, & lactation number. Encouraged mom to keep baby awake & feeding for at least 15-20 mins on one breast and then offer second breast at each feeding. Encouraged mom to feed on cue. Asked mom if she had been taught how to hand express & she stated no but did not want to right now since there was a visitor in the room; encouraged mom to make sure someone teaches her before she goes home. Mom stated she will be going back to work but not for awhile. Mom does not have WIC but has medicaid - [facility] & discussed WIC BF resources. Provided mom with a hand pump - showed her how to use & clean it. Mom reports no ?s at this time. Encouraged mom to ask for LC at next feeding to assess latch.   Maternal Data Has patient been taught Hand Expression?: No (guest in room so mom did not want to try right now)  Feeding Feeding Type: Breast Fed Length of feed: 15 min  LATCH Score/Interventions                      Lactation Tools Discussed/Used WIC Program: No Pump Review: Setup, frequency, and cleaning   Consult Status Consult Status: Follow-up Date: 01/04/16 Follow-up type: In-patient    Oneal GroutLaura C Elizabella Nolet 01/03/2016, 2:06 PM

## 2016-01-04 MED ORDER — DOCUSATE SODIUM 100 MG PO CAPS: 100.0000 mg | ORAL_CAPSULE | Freq: Two times a day (BID) | ORAL | Status: AC

## 2016-01-04 MED ORDER — IBUPROFEN 600 MG PO TABS: 600.0000 mg | ORAL_TABLET | Freq: Four times a day (QID) | ORAL | Status: DC | PRN

## 2016-01-04 MED ORDER — OXYCODONE-ACETAMINOPHEN 5-325 MG PO TABS: 1.0000 | ORAL_TABLET | ORAL | Status: DC | PRN

## 2016-01-04 NOTE — Lactation Note (Signed)
This note was copied from the chart of Sherri [place] Yeatts. Lactation Consultation Note  Patient Name: Sherri Suszanne FinchBrittany Young Today's Date: 01/04/2016  baby is 4036 hours old and at the start of the consult baby had been feeding since 0915 ( 10 mins and released , fussy )  LC assist to re-latch and improved alignment. Per mom more comfortable, swallows noted and still feeding at 0930 am .  Sore nipple and engorgement prevention and tx reviewed. LC checked flange size on other breast and #24 Flange ok for today  But probably when milk comes in #27 Flange will be needed . ( LC provided) . Mom had been given hand pump, LC reviewed instructions)  Mother informed of post-discharge support and given phone number to the lactation department, including services for phone call assistance; out-patient appointments; and breastfeeding support group. List of other breastfeeding resources in the community given in the handout. Encouraged mother to call for problems or concerns related to breastfeeding.    Maternal Data    Feeding Feeding Type: Breast Fed Length of feed:  (LC obs baby releasing , and LC assist re-latch , swallows )  LATCH Score/Interventions Latch: Grasps breast easily, tongue down, lips flanged, rhythmical sucking.  Audible Swallowing: A few with stimulation  Type of Nipple: Flat  Comfort (Breast/Nipple): Soft / non-tender     Hold (Positioning): No assistance needed to correctly position infant at breast.  LATCH Score: 8  Lactation Tools Discussed/Used     Consult Status      [name], [name] 01/04/2016, 9:40 AM

## 2016-01-04 NOTE — Discharge Summary (Signed)
Obstetric Discharge Summary Reason for Admission: onset of labor Prenatal Procedures: ultrasound Intrapartum Procedures: spontaneous vaginal delivery Postpartum Procedures: none Complications-Operative and Postpartum: none HEMOGLOBIN  Date Value Ref Range Status  01/03/2016 10.2* 12.0 - 15.0 g/dL Final   HCT  Date Value Ref Range Status  01/03/2016 31.2* 36.0 - 46.0 % Final    Physical Exam:  General: alert, cooperative and appears stated age 46Lochia: appropriate Uterine Fundus: firm  Discharge Diagnoses: Term Pregnancy-delivered  Discharge Information: Date: 01/04/2016 Activity: pelvic rest Diet: routine Medications: Ibuprofen, Colace and Percocet Condition: improved Instructions: refer to practice specific booklet Discharge to: home Follow-up Information    Follow up with Almon HerculesOSS,Wadie Mattie H., MD In 4 weeks.   Specialty:  Obstetrics and Gynecology   Why:  Follow up in 4-6 weeks for a post-partum evaluation   Contact information:   [address] [place] [id number] [phone]       Newborn Data: Live born female  Birth Weight: 8 lb 1.8 oz (3680 g) APGAR: 8, 9  Home with mother.  Geriann Lafont H. 01/04/2016, 12:29 PM

## 2016-01-04 NOTE — Discharge Instructions (Signed)

## 2016-01-05 NOTE — Progress Notes (Signed)
Post discharge chart review completed.  

## 2016-01-26 NOTE — H&P (Signed)
Sherri Young is a 25 y.o. female presenting for labor  25 yo G1 P0 presents for evaluation of labor and was admitted History OB History as of 01/17/16    Gravida Para Term Preterm AB TAB SAB Ectopic Multiple Living   0 1     No past medical history on file. History reviewed. No pertinent past surgical history. Family History: family history is not on file. Social History:  reports that she has never smoked. She does not have any smokeless tobacco history on file. She reports that she does not drink alcohol or use illicit drugs.   Prenatal Transfer Tool  Maternal Diabetes: No Genetic Screening: Normal Maternal Ultrasounds/Referrals: Normal Fetal Ultrasounds or other Referrals:  None Maternal Substance Abuse:  No Significant Maternal Medications:  None Significant Maternal Lab Results:  None Other Comments:  None  ROS  Dilation: 10 Effacement (%): 100 Station: +1 Exam by:: C.Okoroji RN-BSN Blood pressure 95/54, pulse 77, temperature 98.2 F (36.8 C), temperature source Oral, resp. rate 18, height  (1.575 m), weight 79.379 kg (175 lb), SpO2 99 %, unknown if currently breastfeeding. Exam Physical Exam  Prenatal labs: ABO, Rh: --/--/O POS, O POS (01/06 1300) Antibody: NEG (01/06 1300) Rubella: Immune (06/16 0000) RPR: Non Reactive (01/06 1300)  HBsAg: Negative (06/16 0000)  HIV: Non-reactive (06/16 0000)  GBS: Negative (01/06 0000)   Assessment/Plan: 1) Admit 2) Epidural on request    Sherri Young H. 01/26/2016, 12:58 PM

## 2018-08-24 ENCOUNTER — Inpatient Hospital Stay (HOSPITAL_COMMUNITY): Payer: Self-pay

## 2018-08-24 ENCOUNTER — Encounter (HOSPITAL_COMMUNITY): Payer: Self-pay | Admitting: *Deleted

## 2018-08-24 ENCOUNTER — Inpatient Hospital Stay (HOSPITAL_COMMUNITY)
Admission: AD | Admit: 2018-08-24 | Discharge: 2018-08-25 | Disposition: A | Discharge status: Home / Self Care | Payer: Self-pay | Source: Ambulatory Visit | Attending: Obstetrics & Gynecology | Admitting: Obstetrics & Gynecology

## 2018-08-24 DIAGNOSIS — O26891 Other specified pregnancy related conditions, first trimester: Secondary | ICD-10-CM | POA: Insufficient documentation

## 2018-08-24 DIAGNOSIS — A5901 Trichomonal vulvovaginitis: Secondary | ICD-10-CM

## 2018-08-24 DIAGNOSIS — R102 Pelvic and perineal pain: Secondary | ICD-10-CM | POA: Insufficient documentation

## 2018-08-24 DIAGNOSIS — O23591 Infection of other part of genital tract in pregnancy, first trimester: Secondary | ICD-10-CM

## 2018-08-24 DIAGNOSIS — Z3490 Encounter for supervision of normal pregnancy, unspecified, unspecified trimester: Secondary | ICD-10-CM

## 2018-08-24 DIAGNOSIS — O26899 Other specified pregnancy related conditions, unspecified trimester: Secondary | ICD-10-CM

## 2018-08-24 DIAGNOSIS — Z3A01 Less than 8 weeks gestation of pregnancy: Secondary | ICD-10-CM | POA: Insufficient documentation

## 2018-08-24 LAB — CBC
HCT: 36.5 % (ref 36.0–46.0)
HEMOGLOBIN: 11.9 g/dL — AB (ref 12.0–15.0)
MCH: 30 pg (ref 26.0–34.0)
MCHC: 32.6 g/dL (ref 30.0–36.0)
MCV: 91.9 fL (ref 78.0–100.0)
Platelets: 364 10*3/uL (ref 150–400)
RBC: 3.97 MIL/uL (ref 3.87–5.11)
RDW: 12.8 % (ref 11.5–15.5)
WBC: 15.3 10*3/uL — ABNORMAL HIGH (ref 4.0–10.5)

## 2018-08-24 LAB — URINALYSIS, ROUTINE W REFLEX MICROSCOPIC
Bilirubin Urine: NEGATIVE
Glucose, UA: NEGATIVE mg/dL
KETONES UR: NEGATIVE mg/dL
Nitrite: NEGATIVE
PH: 5 (ref 5.0–8.0)
Protein, ur: NEGATIVE mg/dL
SPECIFIC GRAVITY, URINE: 1.021 (ref 1.005–1.030)

## 2018-08-24 LAB — HCG, QUANTITATIVE, PREGNANCY: hCG, Beta Chain, Quant, S: 15296 m[IU]/mL — ABNORMAL HIGH (ref <5)

## 2018-08-24 LAB — POCT PREGNANCY, URINE: Preg Test, Ur: POSITIVE — AB

## 2018-08-24 LAB — WET PREP, GENITAL
Clue Cells Wet Prep HPF POC: NONE SEEN
SPERM: NONE SEEN
YEAST WET PREP: NONE SEEN

## 2018-08-24 MED ORDER — METRONIDAZOLE 500 MG PO TABS
2000.0000 mg | ORAL_TABLET | Freq: Once | ORAL | Status: AC
  Administered 2018-08-25: 2000 mg via ORAL
  Filled 2018-08-24: qty 4

## 2018-08-24 NOTE — MAU Provider Note (Signed)
Chief Complaint: No chief complaint on file. Provider saw patient at 2347     SUBJECTIVE HPI: Sherri Young is a 27 y.o. G1P1001 at Unknown by LMP who presents to maternity admissions reporting single sharp pain on left side.  Had numbness in left leg that resolved.  Had + HPT on 08/13/18. She denies vaginal bleeding, vaginal itching/burning, urinary symptoms, h/a, dizziness, n/v, or fever/chills.    Abdominal Pain  This is a new problem. The current episode started today. The onset quality is sudden. The problem occurs rarely. The problem has been resolved. The pain is located in the LLQ. The quality of the pain is cramping. Pertinent negatives include no anorexia, constipation, diarrhea, dysuria or fever. Nothing aggravates the pain. The pain is relieved by nothing. She has tried nothing for the symptoms.    RN Note: PT SAYS   AT 5 PM TODAY  SHE HAD SHARP PAIN ON LEFT SIDE - ,  AT 7 PM LEFT LEG  FELT NUMB .  PAIN SAME AND MOVED TO HER BACK .  DID HPT  8-18 - POSITIVE.  [facility] - ON 8-21-  LABS AND U/S-  SAID DIFFICULT TO FIND [facility]- . PT CALLED TODAY - TOLD TO  COME HERE.   History reviewed. No pertinent past medical history. History reviewed. No pertinent surgical history. Social History   Socioeconomic History  . Marital status: Single    Spouse name: Not on file  . Number of children: Not on file  . Years of education: Not on file  . Highest education level: Not on file  Occupational History  . Not on file  Social Needs  . Financial resource strain: Not on file  . Food insecurity:    Worry: Not on file    Inability: Not on file  . Transportation needs:    Medical: Not on file    Non-medical: Not on file  Tobacco Use  . Smoking status: Never Smoker  Substance and Sexual Activity  . Alcohol use: No  . Drug use: No  . Sexual activity: Yes    Birth control/protection: None  Lifestyle  . Physical activity:    Days per week: Not on file    Minutes per  session: Not on file  . Stress: Not on file  Relationships  . Social connections:    Talks on phone: Not on file    Gets together: Not on file    Attends religious service: Not on file    Active member of club or organization: Not on file    Attends meetings of clubs or organizations: Not on file    Relationship status: Not on file  . Intimate partner violence:    Fear of current or ex partner: Not on file    Emotionally abused: Not on file    Physically abused: Not on file    Forced sexual activity: Not on file  Other Topics Concern  . Not on file  Social History Narrative  . Not on file   No current facility-administered medications on file prior to encounter.    Current Outpatient Medications on File Prior to Encounter  Medication Sig Dispense Refill  . docusate sodium (COLACE) 100 MG capsule Take 1 capsule (100 mg total) by mouth 2 (two) times daily. 60 capsule 0  . ibuprofen (ADVIL,MOTRIN) 600 MG tablet Take 1 tablet (600 mg total) by mouth every 6 (six) hours as needed. 90 tablet 0  . oxyCODONE-acetaminophen (ROXICET) 5-325 MG  tablet Take 1-2 tablets by mouth every 4 (four) hours as needed for severe pain. 30 tablet 0  . Prenatal Vit-Fe Fumarate-FA (PRENATAL MULTIVITAMIN) TABS tablet Take 1 tablet by mouth daily at 12 noon.     Allergies  Allergen Reactions  . Watermelon [Citrullus Vulgaris] Itching    ALL MELONS    I have reviewed patient's Past Medical Hx, Surgical Hx, Family Hx, Social Hx, medications and allergies.   ROS:  Review of Systems  Constitutional: Negative for fever.  Gastrointestinal: Positive for abdominal pain. Negative for anorexia, constipation and diarrhea.  Genitourinary: Negative for dysuria.   Review of Systems  Other systems negative   Physical Exam  Physical Exam Patient Vitals for the past 24 hrs:  BP Temp Temp src Pulse Resp Height Weight  08/24/18 2058 118/63 98.9 F (37.2 C) Oral 85 20 5\' 2"  (1.575 m) 70.9 kg   Constitutional:  Well-developed, well-nourished female in no acute distress.  Cardiovascular: normal rate Respiratory: normal effort GI: Abd soft, non-tender. Pos BS x 4 MS: Extremities nontender, no edema, normal ROM Neurologic: Alert and oriented x 4.  GU: Neg CVAT.  PELVIC EXAM: Cervix pink, visually closed, without lesion, scant white creamy discharge, vaginal walls and external genitalia normal Bimanual exam: Cervix 0/long/high, firm, anterior, neg CMT, uterus nontender, nonenlarged, adnexa without tenderness, enlargement, or mass  LAB RESULTS Results for orders placed or performed during the hospital encounter of 08/24/18 (from the past 24 hour(s))  Pregnancy, urine POC     Status: Abnormal   Collection Time: 08/24/18  9:17 PM  Result Value Ref Range   Preg Test, Ur POSITIVE (A) NEGATIVE  Urinalysis, Routine w reflex microscopic     Status: Abnormal   Collection Time: 08/24/18  9:20 PM  Result Value Ref Range   Color, Urine YELLOW YELLOW   APPearance HAZY (A) CLEAR   Specific Gravity, Urine 1.021 1.005 - 1.030   pH 5.0 5.0 - 8.0   Glucose, UA NEGATIVE NEGATIVE mg/dL   Hgb urine dipstick SMALL (A) NEGATIVE   Bilirubin Urine NEGATIVE NEGATIVE   Ketones, ur NEGATIVE NEGATIVE mg/dL   Protein, ur NEGATIVE NEGATIVE mg/dL   Nitrite NEGATIVE NEGATIVE   Leukocytes, UA LARGE (A) NEGATIVE   RBC / HPF 0-5 0 - 5 RBC/hpf   WBC, UA 21-50 0 - 5 WBC/hpf   Bacteria, UA RARE (A) NONE SEEN   Squamous Epithelial / LPF 11-20 0 - 5   Mucus PRESENT   hCG, quantitative, pregnancy     Status: Abnormal   Collection Time: 08/24/18 10:03 PM  Result Value Ref Range   hCG, Beta Chain, Quant, S 15,296 (H) <5 mIU/mL  CBC     Status: Abnormal   Collection Time: 08/24/18 10:03 PM  Result Value Ref Range   WBC 15.3 (H) 4.0 - 10.5 K/uL   RBC 3.97 3.87 - 5.11 MIL/uL   Hemoglobin 11.9 (L) 12.0 - 15.0 g/dL   HCT 16.136.5 09.636.0 - 04.546.0 %   MCV 91.9 78.0 - 100.0 fL   MCH 30.0 26.0 - 34.0 pg   MCHC 32.6 30.0 - 36.0 g/dL   RDW  40.912.8 81.111.5 - 91.415.5 %   Platelets 364 150 - 400 K/uL  Wet prep, genital     Status: Abnormal   Collection Time: 08/24/18 11:29 PM  Result Value Ref Range   Yeast Wet Prep HPF POC NONE SEEN NONE SEEN   Trich, Wet Prep PRESENT (A) NONE SEEN   Clue Cells Wet Prep  HPF POC NONE SEEN NONE SEEN   WBC, Wet Prep HPF POC MANY (A) NONE SEEN   Sperm NONE SEEN       IMAGING US Ob Comp Less 14 Wks  Result Date: 08/24/2018 CLINICAL DATA:  Left-sided abdominal pain. Patient is 5 weeks and 6 days pregnant, according to last menstrual period. EXAM: OBSTETRIC <14 WK [place] AND TRANSVAGINAL OB US TECHNIQUE: Both transabdominal and transvaginal ultrasound examinations were performed for complete evaluation of the gestation as well as the maternal uterus, adnexal regions, and pelvic cul-de-sac. Transvaginal technique was performed to assess early pregnancy. COMPARISON:  None. FINDINGS: Intrauterine gestational sac: Single Yolk sac:  Visualized. Embryo:  Visualized. Cardiac Activity: Visualized. Heart Rate: 95 bpm CRL: 2.4 mm mm   5 w   5 d                  [place] EDC: 04/21/2019 Subchorionic hemorrhage:  None visualized. Maternal uterus/adnexae: Normal appearance of the ovaries. IMPRESSION: Single live intrauterine pregnancy corresponding to 5 weeks and 5 days gestation. Electronically Signed   By: Ted Mcalpine M.D.   On: 08/24/2018 23:53   US Ob Transvaginal  Result Date: 08/24/2018 CLINICAL DATA:  Left-sided abdominal pain. Patient is 5 weeks and 6 days pregnant, according to last menstrual period. EXAM: OBSTETRIC <14 WK [place] AND TRANSVAGINAL OB US TECHNIQUE: Both transabdominal and transvaginal ultrasound examinations were performed for complete evaluation of the gestation as well as the maternal uterus, adnexal regions, and pelvic cul-de-sac. Transvaginal technique was performed to assess early pregnancy. COMPARISON:  None. FINDINGS: Intrauterine gestational sac: Single Yolk sac:  Visualized. Embryo:  Visualized.  Cardiac Activity: Visualized. Heart Rate: 95 bpm CRL: 2.4 mm mm   5 w   5 d                  [place] EDC: 04/21/2019 Subchorionic hemorrhage:  None visualized. Maternal uterus/adnexae: Normal appearance of the ovaries. IMPRESSION: Single live intrauterine pregnancy corresponding to 5 weeks and 5 days gestation. Electronically Signed   By: Ted Mcalpine M.D.   On: 08/24/2018 23:53    MAU Management/MDM: Ordered usual first trimester r/o ectopic labs.   Pelvic exam and cultures done Will check baseline Ultrasound to rule out ectopic.  This bleeding/pain can represent a normal pregnancy with bleeding, spontaneous abortion or even an ectopic which can be life-threatening.  The process as listed above helps to determine which of these is present.  Reviewed results which showed single live intrauterine gestation  Discussed Trichomonas diagnosis Given Flagyl 2gm x 1 with Partner expedited Rx  Discussed abstinence x 2 weeks then condoms after both treated  ASSESSMENT 1. Pelvic pain affecting pregnancy   2. Pelvic pain affecting pregnancy   3.      Trichomonal vaginitis  PLAN Discharge home Expedited partner treatment Rx given Encouraged to seek prenatal care Pt stable at time of discharge. Encouraged to return here or to other Urgent Care/ED if she develops worsening of symptoms, increase in pain, fever, or other concerning symptoms.    Wynelle Bourgeois CNM, MSN Certified Nurse-Midwife 08/24/2018  11:46 PM

## 2018-08-24 NOTE — MAU Note (Signed)
PT SAYS   AT 5 PM TODAY  SHE HAD SHARP PAIN ON LEFT SIDE - ,  AT 7 PM LEFT LEG  FELT NUMB .  PAIN SAME AND MOVED TO HER BACK .  DID HPT  8-18 - POSITIVE.  WENT TO [facility] - ON 8-21-  LABS AND U/S-  SAID DIFFICULT TO FIND [facility]- . PT CALLED TODAY - TOLD TO  COME HERE.

## 2018-08-25 ENCOUNTER — Encounter: Payer: Self-pay | Admitting: Advanced Practice Midwife

## 2018-08-25 DIAGNOSIS — R102 Pelvic and perineal pain: Secondary | ICD-10-CM

## 2018-08-25 DIAGNOSIS — O26891 Other specified pregnancy related conditions, first trimester: Secondary | ICD-10-CM

## 2018-08-25 LAB — GC/CHLAMYDIA PROBE AMP (~~LOC~~) NOT AT ARMC
CHLAMYDIA, DNA PROBE: NEGATIVE
NEISSERIA GONORRHEA: NEGATIVE

## 2018-08-25 LAB — HIV ANTIBODY (ROUTINE TESTING W REFLEX): HIV SCREEN 4TH GENERATION: NONREACTIVE

## 2018-08-25 NOTE — Discharge Instructions (Signed)
Trichomoniasis °Trichomoniasis is an STI (sexually transmitted infection) that can affect both women and men. In women, the outer area of the female genitalia (vulva) and the vagina are affected. In men, the penis is mainly affected, but the prostate and other reproductive organs can also be involved. This condition can be treated with medicine. It often has no symptoms (is asymptomatic), especially in men. °What are the causes? °This condition is caused by an organism called Trichomonas vaginalis. Trichomoniasis most often spreads from person to person (is contagious) through sexual contact. °What increases the risk? °The following factors may make you more likely to develop this condition: °· Having unprotected sexual intercourse. °· Having sexual intercourse with a partner who has trichomoniasis. °· Having multiple sexual partners. °· Having had previous trichomoniasis infections or other STIs. ° °What are the signs or symptoms? °In women, symptoms of trichomoniasis include: °· Abnormal vaginal discharge that is clear, white, gray, or yellow-green and foamy and has an unusual "fishy" odor. °· Itching and irritation of the vagina and vulva. °· Burning or pain during urination or sexual intercourse. °· Genital redness and swelling. ° °In men, symptoms of trichomoniasis include: °· Penile discharge that may be foamy or contain pus. °· Pain in the penis. This may happen only when urinating. °· Itching or irritation inside the penis. °· Burning after urination or ejaculation. ° °How is this diagnosed? °In women, this condition may be found during a routine Pap test or physical exam. It may be found in men during a routine physical exam. Your health care provider may perform tests to help diagnose this infection, such as: °· Urine tests (men and women). °· The following in women: °? Testing the pH of the vagina. °? A vaginal swab test that checks for the Trichomonas vaginalis organism. °? Testing vaginal  secretions. ° °Your health care provider may test you for other STIs, including HIV (human immunodeficiency virus). °How is this treated? °This condition is treated with medicine taken by mouth (orally), such as metronidazole or tinidazole to fight the infection. Your sexual partner(s) may also need to be tested and treated. °· If you are a woman and you plan to become pregnant or think you may be pregnant, tell your health care provider right away. Some medicines that are used to treat the infection should not be taken during pregnancy. ° °Your health care provider may recommend over-the-counter medicines or creams to help relieve itching or irritation. You may be tested for infection again 3 months after treatment. °Follow these instructions at home: °· Take and use over-the-counter and prescription medicines, including creams, only as told by your health care provider. °· Do not have sexual intercourse until one week after you finish your medicine, or until your health care provider approves. Ask your health care provider when you may resume sexual intercourse. °· (Women) Do not douche or wear tampons while you have the infection. °· Discuss your infection with your sexual partner(s). Make sure that your partner gets tested and treated, if necessary. °· Keep all follow-up visits as told by your health care provider. This is important. °How is this prevented? °· Use condoms every time you have sex. Using condoms correctly and consistently can help protect against STIs. °· Avoid having multiple sexual partners. °· Talk with your sexual partner about any symptoms that either of you may have, as well as any history of STIs. °· Get tested for STIs and STDs (sexually transmitted diseases) before you have sex. Ask your partner   to do the same.  Do not have sexual contact if you have symptoms of trichomoniasis or another STI. Contact a health care provider if:  You still have symptoms after you finish your  medicine.  You develop pain in your abdomen.  You have pain when you urinate.  You have bleeding after sexual intercourse.  You develop a rash.  You feel nauseous or you vomit.  You plan to become pregnant or think you may be pregnant. Summary  Trichomoniasis is an STI (sexually transmitted infection) that can affect both women and men.  This condition often has no symptoms (is asymptomatic), especially in men.  You should not have sexual intercourse until one week after you finish your medicine, or until your health care provider approves. Ask your health care provider when you may resume sexual intercourse.  Discuss your infection with your sexual partner. Make sure that your partner gets tested and treated, if necessary. This information is not intended to replace advice given to you by your health care provider. Make sure you discuss any questions you have with your health care provider. Document Released: 06/08/2001 Document Revised: 11/05/2016 Document Reviewed: 11/05/2016 Elsevier Interactive Patient Education  2017 [employer]. First Trimester of Pregnancy The first trimester of pregnancy is from week 1 until the end of week 13 (months 1 through 3). During this time, your baby will begin to develop inside you. At 6-8 weeks, the eyes and face are formed, and the heartbeat can be seen on ultrasound. At the end of 12 weeks, all the baby's organs are formed. Prenatal care is all the medical care you receive before the birth of your baby. Make sure you get good prenatal care and follow all of your doctor's instructions. Follow these instructions at home: Medicines  Take over-the-counter and prescription medicines only as told by your doctor. Some medicines are safe and some medicines are not safe during pregnancy.  Take a prenatal vitamin that contains at least 600 micrograms (mcg) of folic acid.  If you have trouble pooping (constipation), take medicine that will make your  stool soft (stool softener) if your doctor approves. Eating and drinking  Eat regular, healthy meals.  Your doctor will tell you the amount of weight gain that is right for you.  Avoid raw meat and uncooked cheese.  If you feel sick to your stomach (nauseous) or throw up (vomit): ? Eat 4 or 5 small meals a day instead of 3 large meals. ? Try eating a few soda crackers. ? Drink liquids between meals instead of during meals.  To prevent constipation: ? Eat foods that are high in fiber, like fresh fruits and vegetables, whole grains, and beans. ? Drink enough fluids to keep your pee (urine) clear or pale yellow. Activity  Exercise only as told by your doctor. Stop exercising if you have cramps or pain in your lower belly (abdomen) or low back.  Do not exercise if it is too hot, too humid, or if you are in a place of great height (high altitude).  Try to avoid standing for long periods of time. Move your legs often if you must stand in one place for a long time.  Avoid heavy lifting.  Wear low-heeled shoes. Sit and stand up straight.  You can have sex unless your doctor tells you not to. Relieving pain and discomfort  Wear a good support bra if your breasts are sore.  Take warm water baths (sitz baths) to soothe pain or discomfort caused  by hemorrhoids. Use hemorrhoid cream if your doctor says it is okay.  Rest with your legs raised if you have leg cramps or low back pain.  If you have puffy, bulging veins (varicose veins) in your legs: ? Wear support hose or compression stockings as told by your doctor. ? Raise (elevate) your feet for 15 minutes, 3-4 times a day. ? Limit salt in your food. Prenatal care  Schedule your prenatal visits by the twelfth week of pregnancy.  Write down your questions. Take them to your prenatal visits.  Keep all your prenatal visits as told by your doctor. This is important. Safety  Wear your seat belt at all times when driving.  Make a  list of emergency phone numbers. The list should include numbers for family, friends, the hospital, and police and fire departments. General instructions  Ask your doctor for a referral to a local prenatal class. Begin classes no later than at the start of month 6 of your pregnancy.  Ask for help if you need counseling or if you need help with nutrition. Your doctor can give you advice or tell you where to go for help.  Do not use hot tubs, steam rooms, or saunas.  Do not douche or use tampons or scented sanitary pads.  Do not cross your legs for long periods of time.  Avoid all herbs and alcohol. Avoid drugs that are not approved by your doctor.  Do not use any tobacco products, including cigarettes, chewing tobacco, and electronic cigarettes. If you need help quitting, ask your doctor. You may get counseling or other support to help you quit.  Avoid cat litter boxes and soil used by cats. These carry germs that can cause birth defects in the baby and can cause a loss of your baby (miscarriage) or stillbirth.  Visit your dentist. At home, brush your teeth with a soft toothbrush. Be gentle when you floss. Contact a doctor if:  You are dizzy.  You have mild cramps or pressure in your lower belly.  You have a nagging pain in your belly area.  You continue to feel sick to your stomach, you throw up, or you have watery poop (diarrhea).  You have a bad smelling fluid coming from your vagina.  You have pain when you pee (urinate).  You have increased puffiness (swelling) in your face, hands, legs, or ankles. Get help right away if:  You have a fever.  You are leaking fluid from your vagina.  You have spotting or bleeding from your vagina.  You have very bad belly cramping or pain.  You gain or lose weight rapidly.  You throw up blood. It may look like coffee grounds.  You are around people who have [place] measles, fifth disease, or chickenpox.  You have a very bad  headache.  You have shortness of breath.  You have any kind of trauma, such as from a fall or a car accident. Summary  The first trimester of pregnancy is from week 1 until the end of week 13 (months 1 through 3).  To take care of yourself and your unborn baby, you will need to eat healthy meals, take medicines only if your doctor tells you to do so, and do activities that are safe for you and your baby.  Keep all follow-up visits as told by your doctor. This is important as your doctor will have to ensure that your baby is healthy and growing well. This information is not intended to replace  advice given to you by your health care provider. Make sure you discuss any questions you have with your health care provider. Document Released: 05/31/2008 Document Revised: 12/21/2016 Document Reviewed: 12/21/2016 Elsevier Interactive Patient Education  2017 Elsevier Inc[employer]  [facility]    [facility]       Phone: [phone]  [facility] [place] Phone: [phone]  [facility] [place]  Phone: [phone]  [facility]  Phone: [phone]  [facility] [place]  Phone: [phone]  [facility]       Phone: [phone]  [facility]    Phone: [phone]   [facility]    Phone: [phone]  [facility]    Phone: [phone]  [facility]    Phone: [phone]  [facility]    Phone: [phone]  [facility]       Phone: [phone]   [facility]  Phone: [phone]  [facility]    Phone: [phone]  [facility] [place]   Phone: [phone]  [facility]      Phone:  [phone]  [facility]    Phone: [phone]

## 2018-08-26 LAB — CULTURE, OB URINE

## 2018-12-27 ENCOUNTER — Other Ambulatory Visit: Payer: Self-pay

## 2018-12-27 ENCOUNTER — Emergency Department (HOSPITAL_BASED_OUTPATIENT_CLINIC_OR_DEPARTMENT_OTHER)
Admission: EM | Admit: 2018-12-27 | Discharge: 2018-12-27 | Disposition: A | Discharge status: Home / Self Care | Payer: Self-pay | Attending: Emergency Medicine | Admitting: Emergency Medicine

## 2018-12-27 ENCOUNTER — Encounter (HOSPITAL_BASED_OUTPATIENT_CLINIC_OR_DEPARTMENT_OTHER): Payer: Self-pay | Admitting: Emergency Medicine

## 2018-12-27 DIAGNOSIS — J069 Acute upper respiratory infection, unspecified: Secondary | ICD-10-CM | POA: Insufficient documentation

## 2018-12-27 DIAGNOSIS — Z79899 Other long term (current) drug therapy: Secondary | ICD-10-CM | POA: Insufficient documentation

## 2018-12-27 DIAGNOSIS — B9789 Other viral agents as the cause of diseases classified elsewhere: Secondary | ICD-10-CM | POA: Insufficient documentation

## 2018-12-27 MED ORDER — BENZONATATE 100 MG PO CAPS: 100.0000 mg | ORAL_CAPSULE | Freq: Three times a day (TID) | ORAL | 0 refills | Status: DC

## 2018-12-27 NOTE — ED Provider Notes (Signed)
[facility] EMERGENCY DEPARTMENT Provider Note   CSN: [id number] Arrival date & time: 12/27/18  1459     History   Chief Complaint Chief Complaint  Patient presents with  . Cough    HPI Sherri Young is a 28 y.o. female.  The history is provided by the patient. No language interpreter was used.  Cough      Generally healthy 28 year old female presenting with cold symptoms.  Patient report for the past 4 days she has had recurrent cough productive with clear sputum, occasional posttussive emesis, throat irritation from persistent cough, feeling weak, and having occasional bouts of loose stools.  Cough also has some shortness of breath when is persistent.  She tries TheraFlu at home without adequate relief.  She denies any significant fever or body aches, denies any ear pain, abdominal pain, dysuria, or rash.  She has not had a flu shot.  She denies any recent travel.  History reviewed. No pertinent past medical history.  Patient Active Problem List   Diagnosis Date Noted  . Active labor at term 01/02/2016  . Spontaneous vaginal delivery 01/02/2016    History reviewed. No pertinent surgical history.   OB History    Gravida  2   Para  1   Term  1   Preterm      AB      Living  1     SAB      TAB      Ectopic      Multiple  0   Live Births  1            Home Medications    Prior to Admission medications   Medication Sig Start Date End Date Taking? Authorizing Provider  docusate sodium (COLACE) 100 MG capsule Take 1 capsule (100 mg total) by mouth 2 (two) times daily. 01/04/16   Waynard Reeds, MD  Prenatal Vit-Fe Fumarate-FA (PRENATAL MULTIVITAMIN) TABS tablet Take 1 tablet by mouth daily at 12 noon.    [provider]    Family History No family history on file.  Social History Social History   Tobacco Use  . Smoking status: Never Smoker  . Smokeless tobacco: Never Used  Substance Use Topics  . Alcohol use: Yes  .  Drug use: No     Allergies   Watermelon [citrullus vulgaris]   Review of Systems Review of Systems  Respiratory: Positive for cough.   All other systems reviewed and are negative.    Physical Exam Updated Vital Signs BP 140/85 (BP Location: Right Arm)   Pulse 99   Temp 99.2 F (37.3 C) (Oral)   Resp 20   Ht 5\' 2"  (1.575 m)   Wt 65.8 kg   LMP 12/22/2018   SpO2 99%   Breastfeeding Unknown   BMI 26.52 kg/m   Physical Exam Vitals signs and nursing note reviewed.  Constitutional:      General: She is not in acute distress.    Appearance: She is well-developed.  HENT:     Head: Atraumatic.     Comments: Ears: Normal TMs bilaterally Nose: Normal nares Throat: Uvula midline no tonsillar edema no exudates Eyes:     Conjunctiva/sclera: Conjunctivae normal.  Neck:     Musculoskeletal: Neck supple.  Cardiovascular:     Rate and Rhythm: Tachycardia present.     Pulses: Normal pulses.     Heart sounds: Normal heart sounds.  Pulmonary:     Effort: Pulmonary effort is normal.  Breath sounds: Normal breath sounds. No wheezing, rhonchi or rales.  Abdominal:     Palpations: Abdomen is soft.     Tenderness: There is no abdominal tenderness.  Skin:    Findings: No rash.  Neurological:     Mental Status: She is alert and oriented to person, place, and time.      ED Treatments / Results  Labs (all labs ordered are listed, but only abnormal results are displayed) Labs Reviewed - No data to display  EKG None  Radiology No results found.  Procedures Procedures (including critical care time)  Medications Ordered in ED Medications - No data to display   Initial Impression / Assessment and Plan / ED Course  I have reviewed the triage vital signs and the nursing notes.  Pertinent labs & imaging results that were available during my care of the patient were reviewed by me and considered in my medical decision making (see chart for details).     BP 140/85 (BP  Location: Right Arm)   Pulse 99   Temp 99.2 F (37.3 C) (Oral)   Resp 20   Ht 5\' 2"  (1.575 m)   Wt 65.8 kg   LMP 12/22/2018   SpO2 99%   Breastfeeding Unknown   BMI 26.52 kg/m    Final Clinical Impressions(s) / ED Diagnoses   Final diagnoses:  Viral URI with cough    ED Discharge Orders         Ordered    benzonatate (TESSALON) 100 MG capsule  Every 8 hours     12/27/18 1612         4:11 PM Pt symptoms consistent with URI.  Lungs clear, doubt PNA, no hypoxia. Pt will be discharged with symptomatic treatment.  Discussed return precautions.  Pt is hemodynamically stable & in NAD prior to discharge.    Fayrene Helper, PA-C 12/27/18 1612    Gwyneth Sprout, MD 12/28/18 2157

## 2018-12-27 NOTE — ED Triage Notes (Addendum)
Cough, headache and hoarse voice x 2 days.

## 2019-01-08 IMAGING — US US OB COMP LESS 14 WK
1 series · 15 of 28 positions shown · non-contrast
Comparison: None.

CLINICAL DATA: Left-sided abdominal pain. Patient is 5 weeks and 6
days pregnant, according to last menstrual period.

EXAM:
OBSTETRIC <14 WK US AND TRANSVAGINAL OB US
TECHNIQUE: Both transabdominal and transvaginal ultrasound examinations were
performed for complete evaluation of the gestation as well as the
maternal uterus, adnexal regions, and pelvic cul-de-sac.
Transvaginal technique was performed to assess early pregnancy.

[Series 1: us ob comp less 14 wk · 15 of 32 slices shown]
[im 1/32]
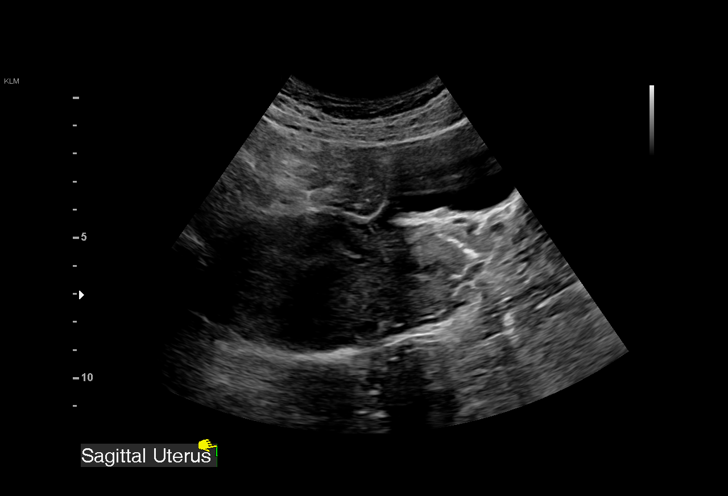
[im 3/32]
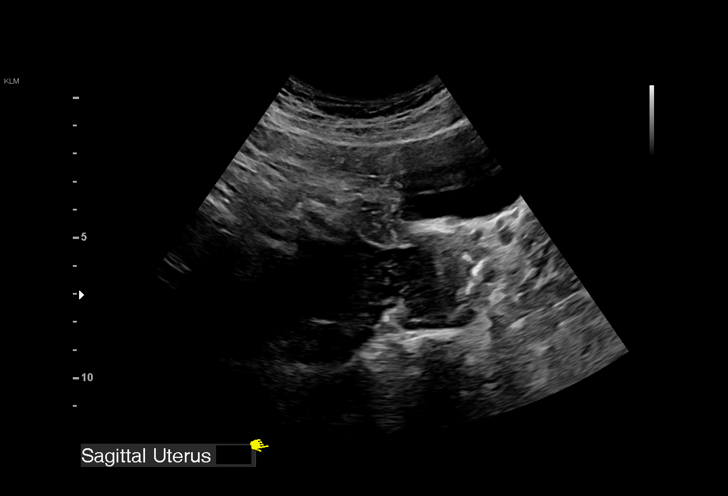
[im 5/32]
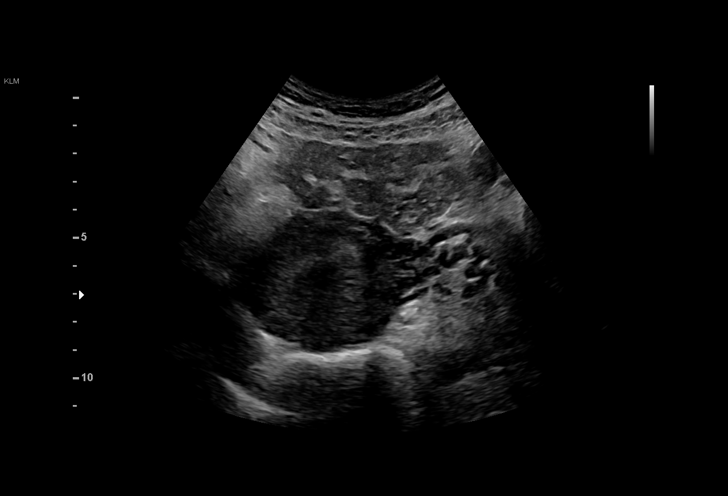
[im 7/32]
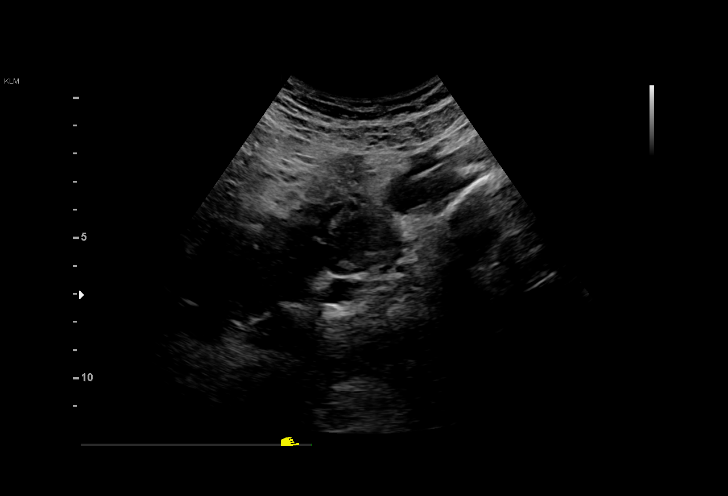
[im 10/32]
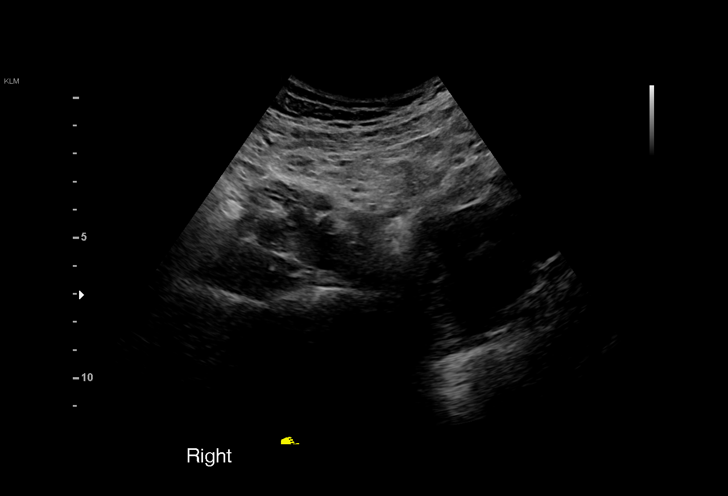
[im 12/32]
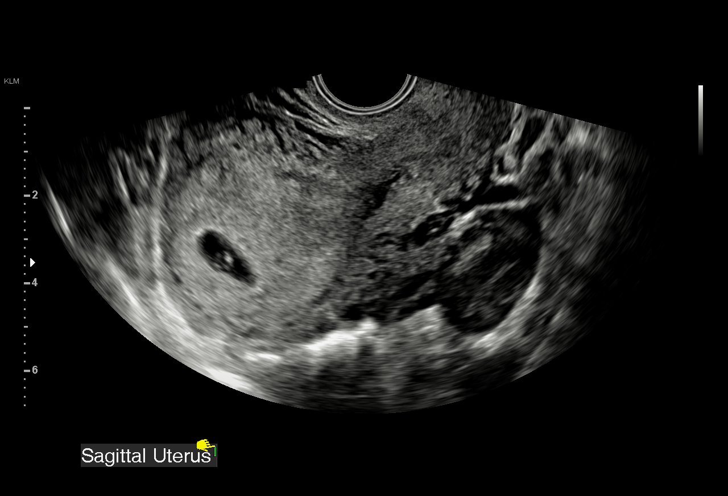
[im 14/32]
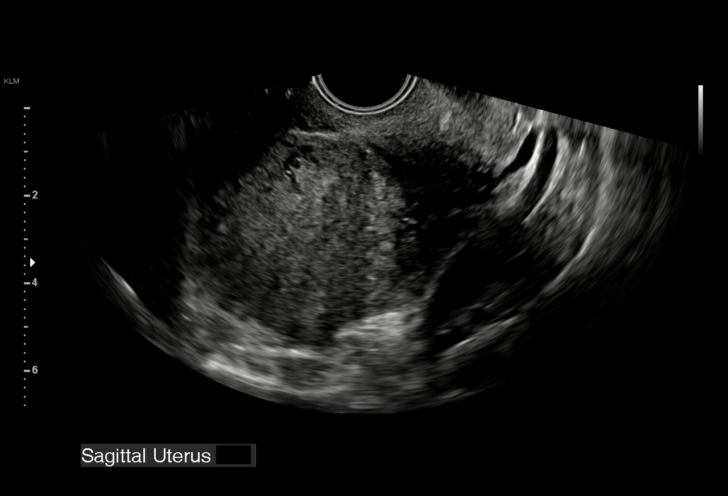
[im 17/32]
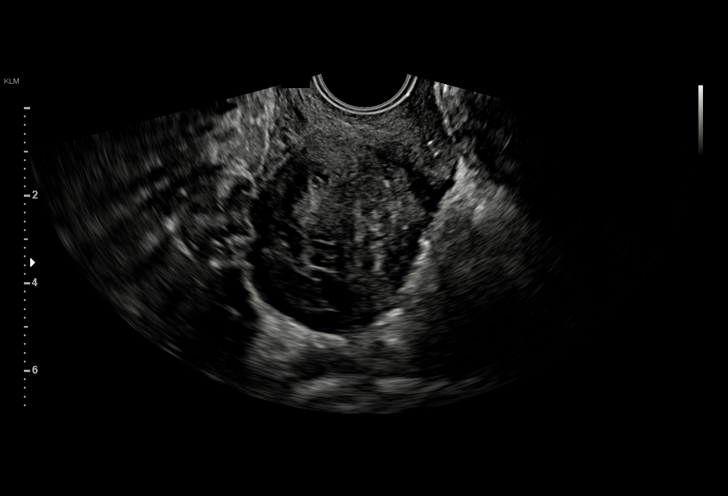
[im 18/32]
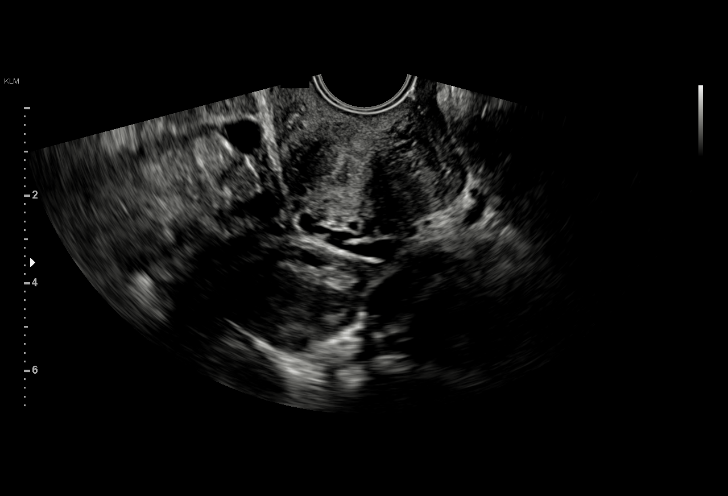
[im 20/32]
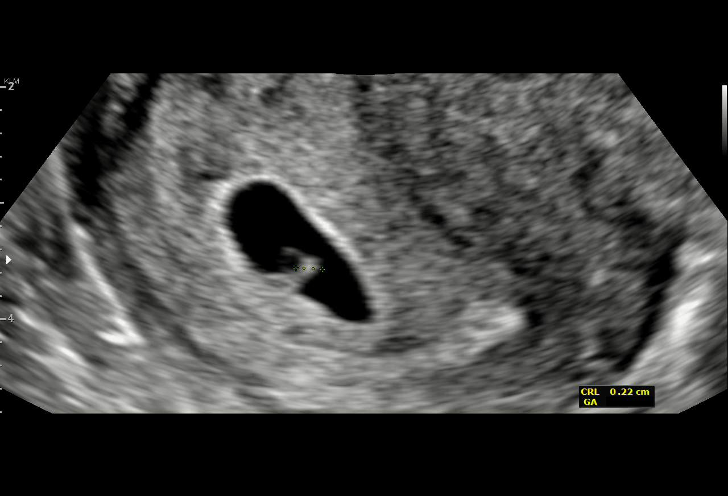
[im 22/32]
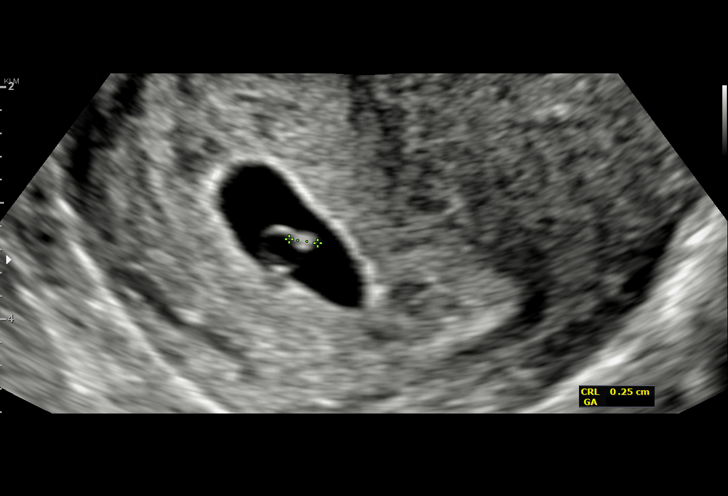
[im 25/32]
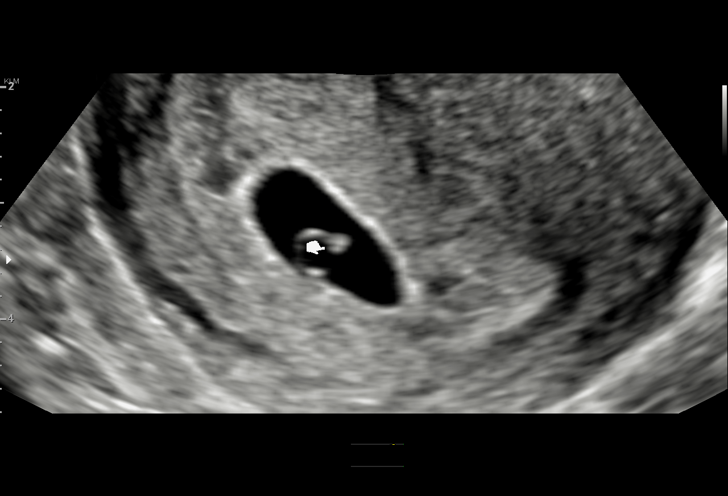
[im 27/32]
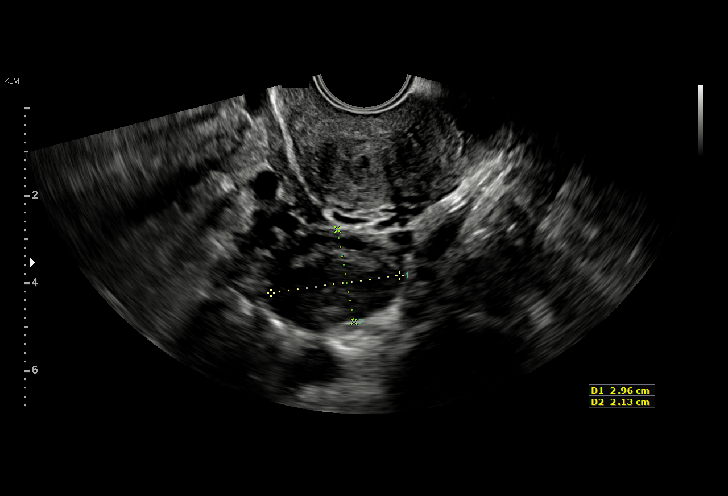
[im 29/32]
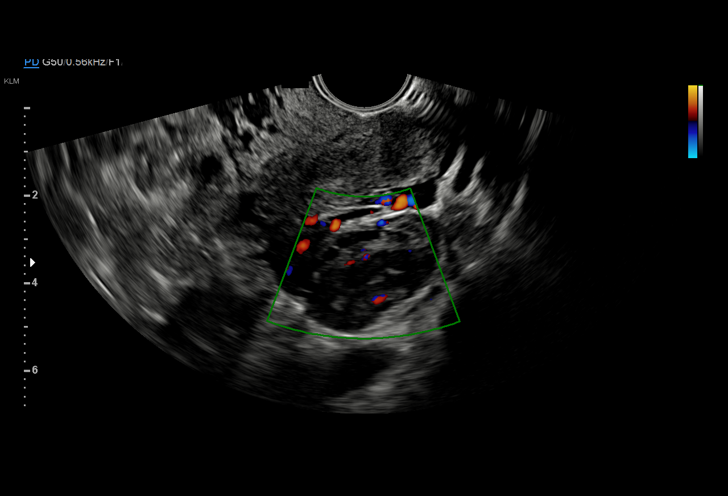
[im 32/32]
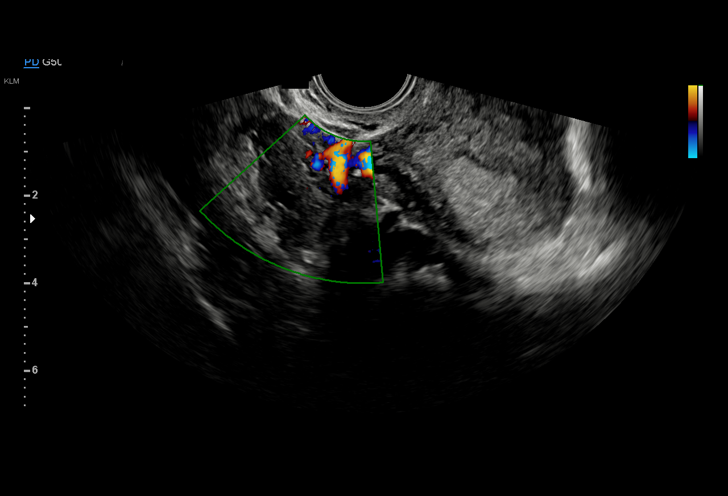

[15 of 28 positions shown; findings below may reference images not displayed]

FINDINGS: Intrauterine gestational sac: Single

Yolk sac:  Visualized.

Embryo:  Visualized.

Cardiac Activity: Visualized.

Heart Rate: 95 bpm

CRL: 2.4 mm mm   5 w   5 d                  US EDC: 04/21/2019

Subchorionic hemorrhage:  None visualized.

Maternal uterus/adnexae: Normal appearance of the ovaries.
IMPRESSION: Single live intrauterine pregnancy corresponding to 5 weeks and 5
days gestation.

## 2019-11-08 ENCOUNTER — Telehealth: Payer: Self-pay | Admitting: Emergency Medicine

## 2019-11-08 DIAGNOSIS — J069 Acute upper respiratory infection, unspecified: Secondary | ICD-10-CM

## 2019-11-08 MED ORDER — ALBUTEROL SULFATE HFA 108 (90 BASE) MCG/ACT IN AERS: 2.0000 | INHALATION_SPRAY | Freq: Four times a day (QID) | RESPIRATORY_TRACT | 0 refills | Status: DC | PRN

## 2019-11-08 MED ORDER — BENZONATATE 100 MG PO CAPS
100.0000 mg | ORAL_CAPSULE | Freq: Three times a day (TID) | ORAL | 0 refills | Status: DC | PRN
Start: 2019-11-08 — End: 2021-12-17

## 2019-11-08 NOTE — Progress Notes (Signed)
E-Visit for Corona Virus Screening   Your current symptoms could be consistent with the coronavirus.  Many health care providers can now test patients at their office but not all are.  [place] has multiple testing sites. For information on our COVID testing locations and hours go to [email or web address]  Please quarantine yourself while awaiting your test results.  We are enrolling you in our MyChart Home [place] for COVID19 . Daily you will receive a questionnaire within the MyChart website. Our COVID 19 response team willl be monitoriing your responses daily.  As discussed on the telephone, it is important that you seek a face-to-face evaluation with a medical provider should you develop worsening shortness of breath.  COVID-19 is a respiratory illness with symptoms that are similar to the flu. Symptoms are typically mild to moderate, but there have been cases of severe illness and death due to the virus. The following symptoms may appear 2-14 days after exposure: . Fever . Cough . Shortness of breath or difficulty breathing . Chills . Repeated shaking with chills . Muscle pain . Headache . Sore throat . New loss of taste or smell . Fatigue . Congestion or runny nose . Nausea or vomiting . Diarrhea  It is vitally important that if you feel that you have an infection such as this virus or any other virus that you stay home and away from places where you may spread it to others.  You should self-quarantine for 14 days if you have symptoms that could potentially be coronavirus or have been in close contact a with a person diagnosed with COVID-19 within the last 2 weeks. You should avoid contact with people age 75 and older.   You should wear a mask or cloth face covering over your nose and mouth if you must be around other people or animals, including pets (even at home). Try to stay at least 6 feet away from other people. This will protect the people around  you.  You can use medication such as A prescription cough medication called Tessalon Perles 100 mg. You may take 1-2 capsules every 8 hours as needed for cough and A prescription inhaler called Albuterol MDI 90 mcg /actuation 2 puffs every 4 hours as needed for shortness of breath, wheezing, cough  You may also take acetaminophen (Tylenol) as needed for fever.   Reduce your risk of any infection by using the same precautions used for avoiding the common cold or flu:  Marland Kitchen Wash your hands often with soap and warm water for at least 20 seconds.  If soap and water are not readily available, use an alcohol-based hand sanitizer with at least 60% alcohol.  . If coughing or sneezing, cover your mouth and nose by coughing or sneezing into the elbow areas of your shirt or coat, into a tissue or into your sleeve (not your hands). . Avoid shaking hands with others and consider head nods or verbal greetings only. . Avoid touching your eyes, nose, or mouth with unwashed hands.  . Avoid close contact with people who are sick. . Avoid places or events with large numbers of people in one location, like concerts or sporting events. . Carefully consider travel plans you have or are making. . If you are planning any travel outside or inside the [place], visit the CDC's Travelers' Health webpage for the latest health notices. . If you have some symptoms but not all symptoms, continue to monitor at home and seek medical attention if your symptoms worsen. Marland Kitchen  If you are having a medical emergency, call 911.  HOME CARE . Only take medications as instructed by your medical team. . Drink plenty of fluids and get plenty of rest. . A steam or ultrasonic humidifier can help if you have congestion.   GET HELP RIGHT AWAY IF YOU HAVE EMERGENCY WARNING SIGNS** FOR COVID-19. If you or someone is showing any of these signs seek emergency medical care immediately. Call 911 or proceed to your closest emergency facility if: . You develop  worsening high fever. . Trouble breathing . Bluish lips or face . Persistent pain or pressure in the chest . New confusion . Inability to wake or stay awake . You cough up blood. . Your symptoms become more severe  **This list is not all possible symptoms. Contact your medical provider for any symptoms that are sever or concerning to you.   MAKE SURE YOU   Understand these instructions.  Will watch your condition.  Will get help right away if you are not doing well or get worse.  Your e-visit answers were reviewed by a board certified advanced clinical practitioner to complete your personal care plan.  Depending on the condition, your plan could have included both over the counter or prescription medications.  If there is a problem please reply once you have received a response from your provider.  Your safety is important to [place].  If you have drug allergies check your prescription carefully.    You can use MyChart to ask questions about today's visit, request a non-urgent call back, or ask for a work or school excuse for 24 hours related to this e-Visit. If it has been greater than 24 hours you will need to follow up with your provider, or enter a new e-Visit to address those concerns. You will get an e-mail in the next two days asking about your experience.  I hope that your e-visit has been valuable and will speed your recovery. Thank you for using e-visits.   Telephone call placed at 10:45AM to clarify patient's reported shortness of breath on questionnairre. Pt identified herself. Pt was speaking in full sentences without perceived difficulty and did not sound winded. Patient reports SOB is very mild and only with significant exertion. Instructed importance of close outpt follow up if symptoms worsen. Pt verbalized understanding.  Greater than 5 minutes, yet less than 10 minutes of time have been spent researching, coordinating, and implementing care for this patient today

## 2019-11-09 ENCOUNTER — Other Ambulatory Visit: Payer: Self-pay

## 2019-11-09 DIAGNOSIS — Z20822 Contact with and (suspected) exposure to covid-19: Secondary | ICD-10-CM

## 2019-11-12 LAB — NOVEL CORONAVIRUS, NAA: SARS-CoV-2, NAA: NOT DETECTED

## 2021-12-17 ENCOUNTER — Other Ambulatory Visit: Payer: Self-pay

## 2021-12-17 ENCOUNTER — Inpatient Hospital Stay (HOSPITAL_COMMUNITY): Payer: Medicaid Other

## 2021-12-17 ENCOUNTER — Encounter (HOSPITAL_COMMUNITY): Payer: Self-pay | Admitting: *Deleted

## 2021-12-17 ENCOUNTER — Inpatient Hospital Stay (HOSPITAL_COMMUNITY)
Admission: AD | Admit: 2021-12-17 | Discharge: 2021-12-17 | Disposition: A | Discharge status: Home / Self Care | Payer: Medicaid Other | Attending: Obstetrics and Gynecology | Admitting: Obstetrics and Gynecology

## 2021-12-17 DIAGNOSIS — O209 Hemorrhage in early pregnancy, unspecified: Secondary | ICD-10-CM | POA: Insufficient documentation

## 2021-12-17 DIAGNOSIS — O3680X0 Pregnancy with inconclusive fetal viability, not applicable or unspecified: Secondary | ICD-10-CM

## 2021-12-17 DIAGNOSIS — R109 Unspecified abdominal pain: Secondary | ICD-10-CM | POA: Diagnosis not present

## 2021-12-17 DIAGNOSIS — O4690 Antepartum hemorrhage, unspecified, unspecified trimester: Secondary | ICD-10-CM | POA: Diagnosis not present

## 2021-12-17 DIAGNOSIS — Z3A01 Less than 8 weeks gestation of pregnancy: Secondary | ICD-10-CM | POA: Insufficient documentation

## 2021-12-17 DIAGNOSIS — O26891 Other specified pregnancy related conditions, first trimester: Secondary | ICD-10-CM | POA: Insufficient documentation

## 2021-12-17 DIAGNOSIS — Z6790 Unspecified blood type, Rh positive: Secondary | ICD-10-CM

## 2021-12-17 LAB — WET PREP, GENITAL
Clue Cells Wet Prep HPF POC: NONE SEEN
Sperm: NONE SEEN
Trich, Wet Prep: NONE SEEN
WBC, Wet Prep HPF POC: >=10 — AB (ref <10)
Yeast Wet Prep HPF POC: NONE SEEN

## 2021-12-17 LAB — URINALYSIS, ROUTINE W REFLEX MICROSCOPIC
Bacteria, UA: NONE SEEN
Bilirubin Urine: NEGATIVE
Glucose, UA: NEGATIVE mg/dL
Ketones, ur: NEGATIVE mg/dL
Leukocytes,Ua: NEGATIVE
Nitrite: NEGATIVE
Protein, ur: NEGATIVE mg/dL
Specific Gravity, Urine: 1.009 (ref 1.005–1.030)
pH: 8 (ref 5.0–8.0)

## 2021-12-17 LAB — CBC
HCT: 35.4 % — ABNORMAL LOW (ref 36.0–46.0)
Hemoglobin: 11.8 g/dL — ABNORMAL LOW (ref 12.0–15.0)
MCH: 30.1 pg (ref 26.0–34.0)
MCHC: 33.3 g/dL (ref 30.0–36.0)
MCV: 90.3 fL (ref 80.0–100.0)
Platelets: 396 10*3/uL (ref 150–400)
RBC: 3.92 MIL/uL (ref 3.87–5.11)
RDW: 11.9 % (ref 11.5–15.5)
WBC: 10.2 10*3/uL (ref 4.0–10.5)
nRBC: 0 % (ref 0.0–0.2)

## 2021-12-17 LAB — HCG, QUANTITATIVE, PREGNANCY: hCG, Beta Chain, Quant, S: 26 m[IU]/mL — ABNORMAL HIGH (ref <5)

## 2021-12-17 LAB — I-STAT BETA HCG BLOOD, ED (MC, WL, AP ONLY): I-stat hCG, quantitative: 29.8 m[IU]/mL — ABNORMAL HIGH (ref <5)

## 2021-12-17 NOTE — ED Provider Notes (Signed)
Emergency Medicine Provider OB Triage Evaluation Note  Sherri Young is a 30 y.o. female, G3P1011, at Unknown gestation who presents to the emergency department with complaints of vaginal bleeding.  Patient states that she is approximately [redacted] weeks pregnant with her third pregnancy.  Patient began having vaginal bleeding earlier this morning.  Bleeding started spotting however now she is passing a few larger clots.  Patient states that she has baseline generalized abdominal pain due to constipation and this is unchanged today.  Denies any vaginal pain, vaginal discharge, nausea, vomiting, lightheadedness, syncope.  LMP 11/16  Review of  Systems  Positive: Vaginal bleeding, abdominal pain Negative: Vaginal pain, vaginal discharge, nausea, vomiting, lightheadedness, syncope  Physical Exam  BP (!) 139/103 (BP Location: Right Arm)    Pulse 92    Temp 98.7 F (37.1 C) (Oral)    Resp 16    LMP 11/11/2021 (Approximate)    SpO2 98%  General: Awake, no distress  HEENT: Atraumatic  Resp: Normal effort  Cardiac: Normal rate Abd: Nondistended, nontender  MSK: Moves all extremities without difficulty Neuro: Speech clear  Medical Decision Making  Pt evaluated for pregnancy concern and is stable for transfer to MAU. Pt is in agreement with plan for transfer.  12:53 PM Discussed with MAU Dr.Eckstat, who accepts patient in transfer.  Clinical Impression  No diagnosis found.     Haskel Schroeder, PA-C 12/17/21 1255    Rozelle Logan, DO 12/18/21 1531

## 2021-12-17 NOTE — MAU Note (Signed)
Sherri Young is a 30 y.o. at [redacted]w[redacted]d here in MAU reporting: this AM when she wiped she saw light pink, states bleeding has gotten a little bit heavier and is seeing some clots. Is not having to wear a pad. Is having some abdominal pain.  LMP:11/11/2021  Onset of complaint: today  Pain score: 6/10  Vitals:   12/17/21 1209 12/17/21 1346  BP: (!) 139/103 121/68  Pulse: 92 69  Resp: 16 16  Temp: 98.7 F (37.1 C) 98.7 F (37.1 C)  SpO2: 98% 97%     Lab orders placed from triage: none

## 2021-12-17 NOTE — ED Triage Notes (Signed)
Pt reports being [redacted] weeks pregnant and began having light vaginal bleeding this am when she used the restroom, has increased to clots pta. No acute distress is noted at this time.

## 2021-12-17 NOTE — MAU Provider Note (Signed)
History     CSN: [id number]  Arrival date and time: 12/17/21 1156   Event Date/Time   First Provider Initiated Contact with Patient 12/17/21 1411      Chief Complaint  Patient presents with   Vaginal Bleeding   Abdominal Pain   30 y.o. G3P1011 @[redacted]w[redacted]d  by sure LMP presenting with VB and cramping. Reports onset today. Bleeding is bright red and she saw some dime sized clots. Has not needed a pad.Cramping is central and constant. Rates 6/10. Has not treated it. Reports +HPT last week.   OB History     Gravida  3   Para  1   Term  1   Preterm      AB      Living  1      SAB      IAB      Ectopic      Multiple  0   Live Births  1           History reviewed. No pertinent past medical history.  History reviewed. No pertinent surgical history.  History reviewed. No pertinent family history.  Social History   Tobacco Use   Smoking status: Never   Smokeless tobacco: Never  Vaping Use   Vaping Use: Never used  Substance Use Topics   Alcohol use: Not Currently   Drug use: No    Allergies:  Allergies  Allergen Reactions   Watermelon [Citrullus Vulgaris] Itching    ALL MELONS    Medications Prior to Admission  Medication Sig Dispense Refill Last Dose   albuterol (VENTOLIN HFA) 108 (90 Base) MCG/ACT inhaler Inhale 2 puffs into the lungs every 6 (six) hours as needed for wheezing or shortness of breath. 18 g 0    benzonatate (TESSALON) 100 MG capsule Take 1 capsule (100 mg total) by mouth 3 (three) times daily as needed for cough. 21 capsule 0    docusate sodium (COLACE) 100 MG capsule Take 1 capsule (100 mg total) by mouth 2 (two) times daily. 60 capsule 0    Prenatal Vit-Fe Fumarate-FA (PRENATAL MULTIVITAMIN) TABS tablet Take 1 tablet by mouth daily at 12 noon.       Review of Systems  Gastrointestinal:  Positive for abdominal pain.  Genitourinary:  Positive for vaginal bleeding.  Physical Exam   Blood pressure 112/65, pulse 75, temperature 98.7  F (37.1 C), temperature source Oral, resp. rate 18, height 5\' 2"  (1.575 m), weight 66.8 kg, last menstrual period 11/11/2021, SpO2 97 %, unknown if currently breastfeeding.  Physical Exam Vitals and nursing note reviewed. Exam conducted with a chaperone present.  Constitutional:      General: She is not in acute distress.    Appearance: Normal appearance.  HENT:     Head: Normocephalic and atraumatic.  Cardiovascular:     Rate and Rhythm: Normal rate.  Pulmonary:     Effort: Pulmonary effort is normal. No respiratory distress.  Abdominal:     General: There is no distension.     Palpations: Abdomen is soft. There is no mass.     Tenderness: There is no abdominal tenderness. There is no guarding or rebound.     Hernia: No hernia is present.  Genitourinary:    Comments: External: mp lesions or erythema Vagina: rugated, pink, moist, small amt drk red bloody discharge, cleared with 1 fox swab Uterus: non enlarged, anteverted, non tender, no CMT Adnexae: no masses, no tenderness left, no tenderness right Cervix closed   Musculoskeletal:  General: Normal range of motion.     Cervical back: Normal range of motion.  Skin:    General: Skin is warm and dry.  Neurological:     General: No focal deficit present.     Mental Status: She is alert and oriented to person, place, and time.  Psychiatric:        Mood and Affect: Mood normal.        Behavior: Behavior normal.   Results for orders placed or performed during the hospital encounter of 12/17/21 (from the past 24 hour(s))  I-Stat Beta hCG blood, ED ([facility], [facility], [facility] only)     Status: Abnormal   Collection Time: 12/17/21 12:26 PM  Result Value Ref Range   I-stat hCG, quantitative 29.8 (H) <5 mIU/mL   Comment 3          Wet prep, genital     Status: Abnormal   Collection Time: 12/17/21  1:55 PM   Specimen: PATH Cytology Cervicovaginal Ancillary Only  Result Value Ref Range   Yeast Wet Prep HPF POC NONE SEEN NONE SEEN   Trich,  Wet Prep NONE SEEN NONE SEEN   Clue Cells Wet Prep HPF POC NONE SEEN NONE SEEN   WBC, Wet Prep HPF POC >=10 (A) <10   Sperm NONE SEEN   CBC     Status: Abnormal   Collection Time: 12/17/21  2:14 PM  Result Value Ref Range   WBC 10.2 4.0 - 10.5 K/uL   RBC 3.92 3.87 - 5.11 MIL/uL   Hemoglobin 11.8 (L) 12.0 - 15.0 g/dL   HCT 03.4 (L) 74.2 - 59.5 %   MCV 90.3 80.0 - 100.0 fL   MCH 30.1 26.0 - 34.0 pg   MCHC 33.3 30.0 - 36.0 g/dL   RDW 63.8 75.6 - 43.3 %   Platelets 396 150 - 400 K/uL   nRBC 0.0 0.0 - 0.2 %  hCG, quantitative, pregnancy     Status: Abnormal   Collection Time: 12/17/21  2:14 PM  Result Value Ref Range   hCG, Beta Chain, Quant, S 26 (H) <5 mIU/mL  Urinalysis, Routine w reflex microscopic Urine, Clean Catch     Status: Abnormal   Collection Time: 12/17/21  2:27 PM  Result Value Ref Range   Color, Urine STRAW (A) YELLOW   APPearance CLEAR CLEAR   Specific Gravity, Urine 1.009 1.005 - 1.030   pH 8.0 5.0 - 8.0   Glucose, UA NEGATIVE NEGATIVE mg/dL   Hgb urine dipstick MODERATE (A) NEGATIVE   Bilirubin Urine NEGATIVE NEGATIVE   Ketones, ur NEGATIVE NEGATIVE mg/dL   Protein, ur NEGATIVE NEGATIVE mg/dL   Nitrite NEGATIVE NEGATIVE   Leukocytes,Ua NEGATIVE NEGATIVE   WBC, UA 0-5 0 - 5 WBC/hpf   Bacteria, UA NONE SEEN NONE SEEN   Squamous Epithelial / LPF 0-5 0 - 5   US OB LESS THAN 14 WEEKS WITH OB TRANSVAGINAL  Result Date: 12/17/2021 CLINICAL DATA:  Vaginal bleeding in 1st trimester pregnancy. EXAM: OBSTETRIC <14 WK [place] AND TRANSVAGINAL OB US TECHNIQUE: Both transabdominal and transvaginal ultrasound examinations were performed for complete evaluation of the gestation as well as the maternal uterus, adnexal regions, and pelvic cul-de-sac. Transvaginal technique was performed to assess early pregnancy. COMPARISON:  None. FINDINGS: Intrauterine gestational sac: None Maternal uterus/adnexae: Both ovaries are normal in appearance. No mass or abnormal free fluid identified.  IMPRESSION: Pregnancy of unknown anatomic location (no intrauterine gestational sac or adnexal mass identified). Differential diagnosis includes recent spontaneous  abortion, IUP too early to visualize, and non-visualized ectopic pregnancy. Recommend correlation with serial beta-hCG levels, and follow up US if warranted clinically. Electronically Signed   By: Danae Orleans M.D.   On: 12/17/2021 15:35    MAU Course  Procedures  MDM Declined analgesic. Labs and [place] ordered and reviewed. No IUGS, YS or FP seen on [place], findings could indicate early pregnancy, ectopic pregnancy, or failed pregnancy, discussed with pt. Will follow quant in 48 hrs. Stable for discharge home.   Assessment and Plan   1. Pregnancy, location unknown   2. Vaginal bleeding in pregnancy   3. Blood type, Rh positive    Discharge home Follow up in MAU on 12/24 @1110  SAB/ectopic precautions  Allergies as of 12/17/2021       Reactions   Watermelon [citrullus Vulgaris] Itching   ALL MELONS        Medication List     STOP taking these medications    benzonatate 100 MG capsule Commonly known as: TESSALON       TAKE these medications    albuterol 108 (90 Base) MCG/ACT inhaler Commonly known as: VENTOLIN HFA Inhale 2 puffs into the lungs every 6 (six) hours as needed for wheezing or shortness of breath.   docusate sodium 100 MG capsule Commonly known as: Colace Take 1 capsule (100 mg total) by mouth 2 (two) times daily.   prenatal multivitamin Tabs tablet Take 1 tablet by mouth daily at 12 noon.       12/19/2021, CNM 12/17/2021, 4:07 PM

## 2021-12-17 NOTE — MAU Note (Addendum)
.  Sherri Young is a 30 y.o. at [redacted]w[redacted]d here in MAU reporting: Vaginal bleeding that began at 1100 am on 12/17/21.  Last intercourse 12/18.   Pain score: 6/10 abdominal cramping  Vitals:   12/17/21 1346 12/17/21 1359  BP: 121/68 112/65  Pulse: 69 75  Resp: 16 18  Temp: 98.7 F (37.1 C)   SpO2: 97%       Lab orders placed: GC/wet prep.

## 2021-12-17 NOTE — ED Notes (Signed)
Report called to MAU and transport called. 

## 2021-12-18 LAB — GC/CHLAMYDIA PROBE AMP (~~LOC~~) NOT AT ARMC
Chlamydia: NEGATIVE
Comment: NEGATIVE
Comment: NORMAL
Neisseria Gonorrhea: NEGATIVE

## 2021-12-19 ENCOUNTER — Inpatient Hospital Stay (HOSPITAL_COMMUNITY)
Admission: AD | Admit: 2021-12-19 | Discharge: 2021-12-19 | Disposition: A | Discharge status: Home / Self Care | Payer: Medicaid Other | Attending: Obstetrics & Gynecology | Admitting: Obstetrics & Gynecology

## 2021-12-19 ENCOUNTER — Other Ambulatory Visit: Payer: Self-pay

## 2021-12-19 ENCOUNTER — Other Ambulatory Visit (HOSPITAL_COMMUNITY)
Admit: 2021-12-19 | Discharge: 2021-12-19 | Disposition: A | Discharge status: Home / Self Care | Payer: Medicaid Other | Attending: Obstetrics & Gynecology | Admitting: Obstetrics & Gynecology

## 2021-12-19 DIAGNOSIS — O039 Complete or unspecified spontaneous abortion without complication: Secondary | ICD-10-CM | POA: Diagnosis not present

## 2021-12-19 DIAGNOSIS — Z3A01 Less than 8 weeks gestation of pregnancy: Secondary | ICD-10-CM | POA: Diagnosis not present

## 2021-12-19 LAB — HCG, QUANTITATIVE, PREGNANCY: hCG, Beta Chain, Quant, S: 5 m[IU]/mL — ABNORMAL HIGH (ref <5)

## 2021-12-19 NOTE — MAU Note (Signed)
Here for repeat blood work. Feeling ok.  Denies pain. Not soaking pads,

## 2021-12-19 NOTE — MAU Provider Note (Signed)
°   Event Date/Time   First Provider Initiated Contact with Patient 12/19/21 1338      S [name] is a 30 y.o. G3P1001 patient who presents to MAU today for follow up hCG.  She had initial test on 12/22 with level of 26.  She reports bleeding has decreased, but is still present.   O BP 117/62 (BP Location: Right Arm)    Pulse 66    Temp 98.1 F (36.7 C) (Oral)    Resp 16    LMP 11/11/2021 (Approximate)    SpO2 100%  Physical Exam Constitutional:      Appearance: Normal appearance.  HENT:     Head: Normocephalic and atraumatic.  Eyes:     Conjunctiva/sclera: Conjunctivae normal.  Cardiovascular:     Rate and Rhythm: Normal rate.     Heart sounds: Normal heart sounds.  Pulmonary:     Effort: Pulmonary effort is normal.     Breath sounds: Normal breath sounds.  Abdominal:     General: Bowel sounds are normal.  Musculoskeletal:        General: Normal range of motion.     Cervical back: Normal range of motion.  Skin:    General: Skin is warm and dry.  Neurological:     Mental Status: She is alert and oriented to person, place, and time.  Psychiatric:        Mood and Affect: Mood normal.        Behavior: Behavior normal.   Results for orders placed or performed during the hospital encounter of 12/19/21 (from the past 24 hour(s))  hCG, quantitative, pregnancy     Status: Abnormal   Collection Time: 12/19/21 11:23 AM  Result Value Ref Range   hCG, Beta Chain, Quant, S 5 (H) <5 mIU/mL     A SAB   P -Informed that hCG level has decreased and therefore diagnosis of SAB given. -Discussed follow up in 1-2 weeks with gynecologist. -Patient states she will contact [facility]. -Reports desire for pregnancy and encouraged to wait until level is at non pregnant state before attempting. -Precautions Given. -Discharge from MAU in stable condition -Patient may return to MAU as needed   Gerrit Heck, [place] 12/19/2021 1:43 PM

## 2022-05-03 IMAGING — US US OB < 14 WEEKS - US OB TV
1 series · 15 of 28 positions shown · non-contrast
Comparison: None.

CLINICAL DATA: Vaginal bleeding in 1st trimester pregnancy.

EXAM:
OBSTETRIC <14 WK US AND TRANSVAGINAL OB US
TECHNIQUE: Both transabdominal and transvaginal ultrasound examinations were
performed for complete evaluation of the gestation as well as the
maternal uterus, adnexal regions, and pelvic cul-de-sac.
Transvaginal technique was performed to assess early pregnancy.

[Series 1: us ob < 14 weeks - us ob tv · 15 of 88 slices shown]
[im 1/88]
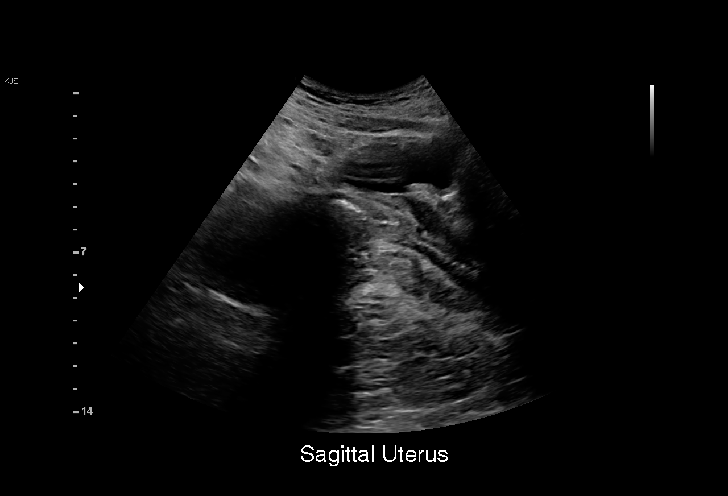
[im 7/88]
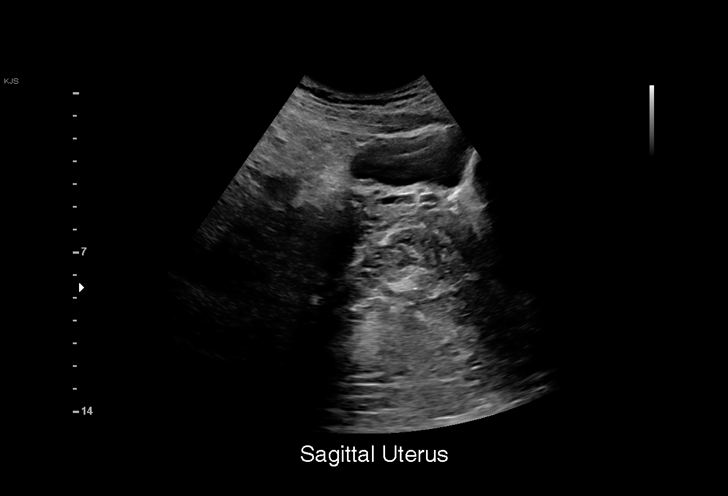
[im 13/88]
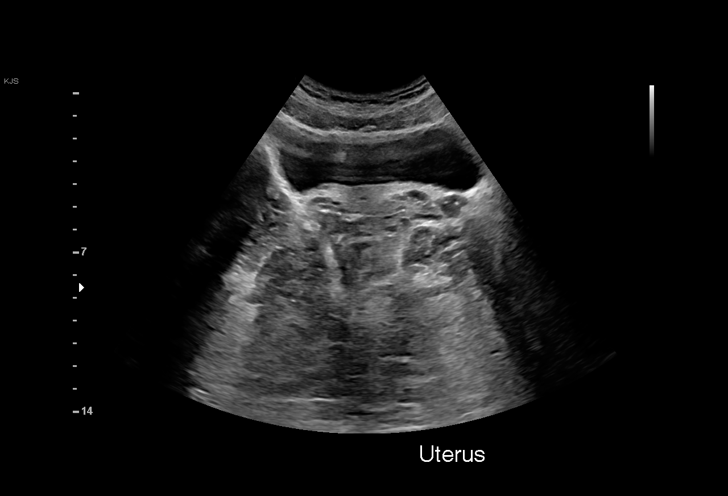
[im 20/88]
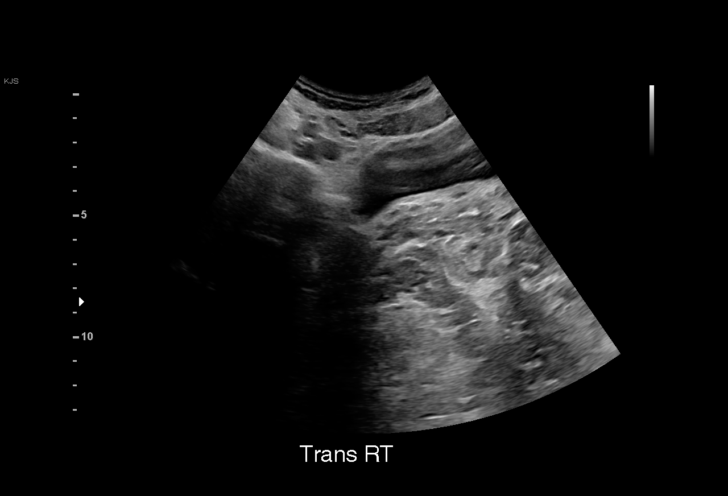
[im 26/88]
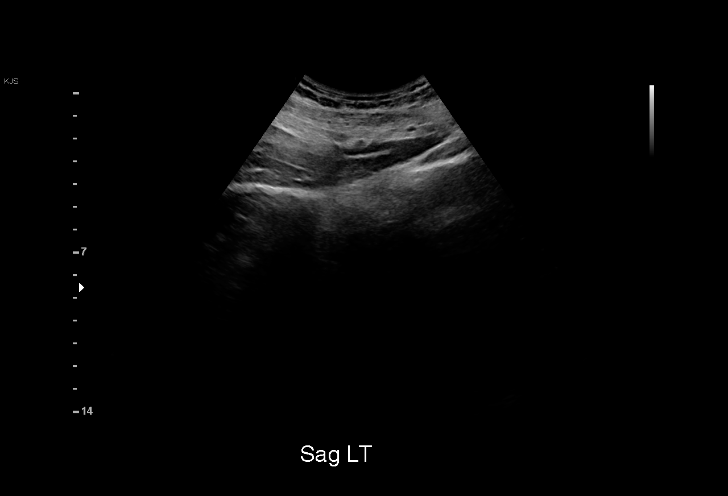
[im 33/88]
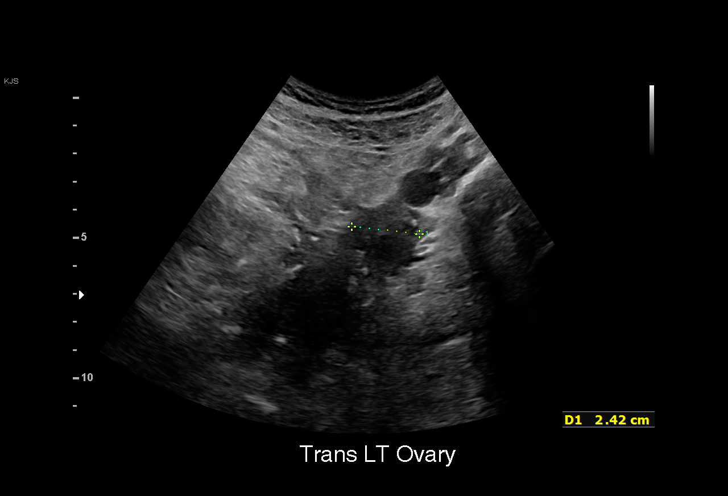
[im 39/88]
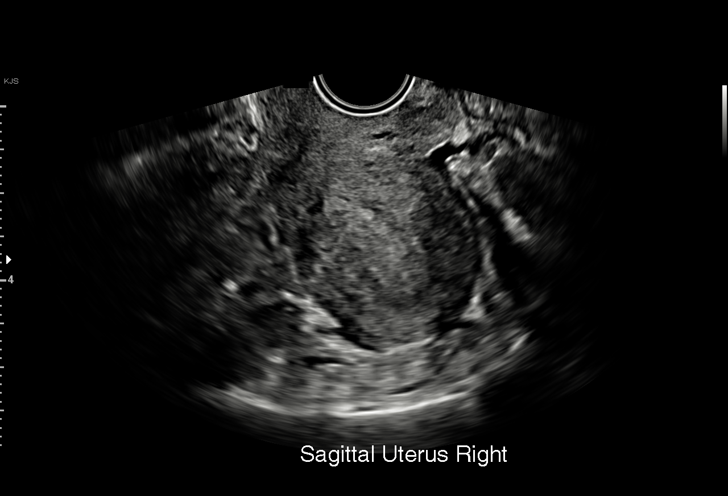
[im 46/88]
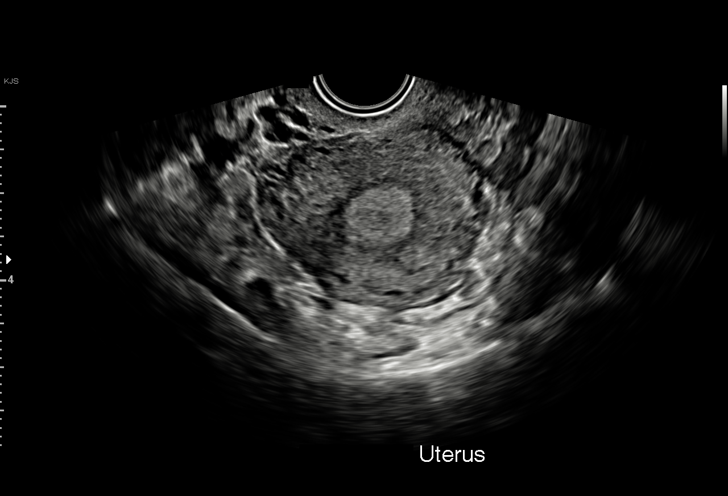
[im 49/88]
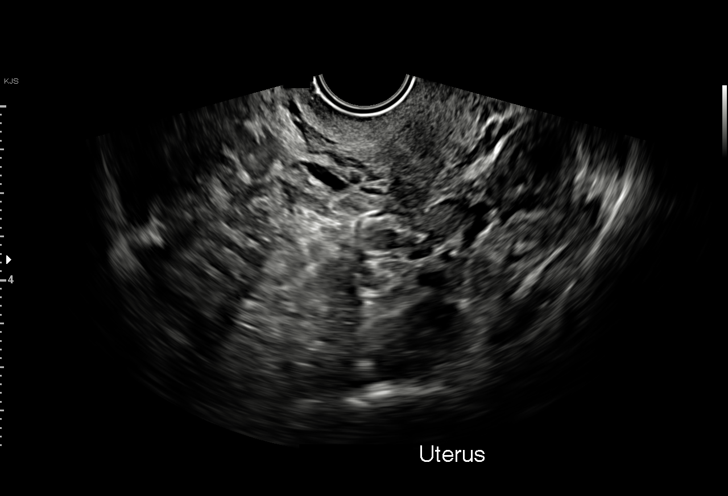
[im 55/88]
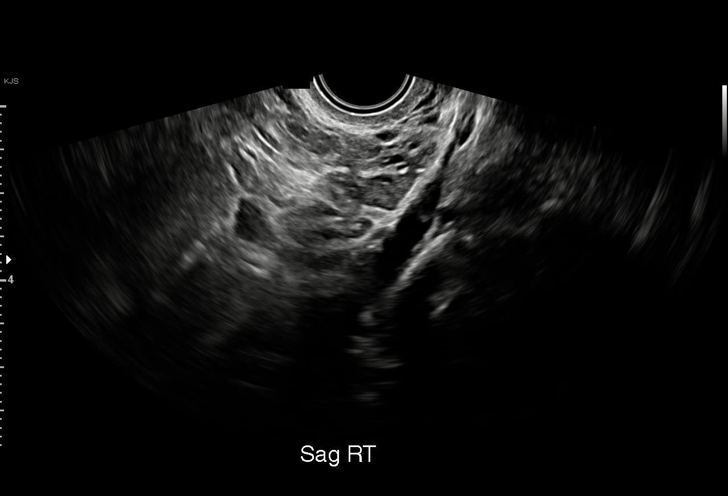
[im 62/88]
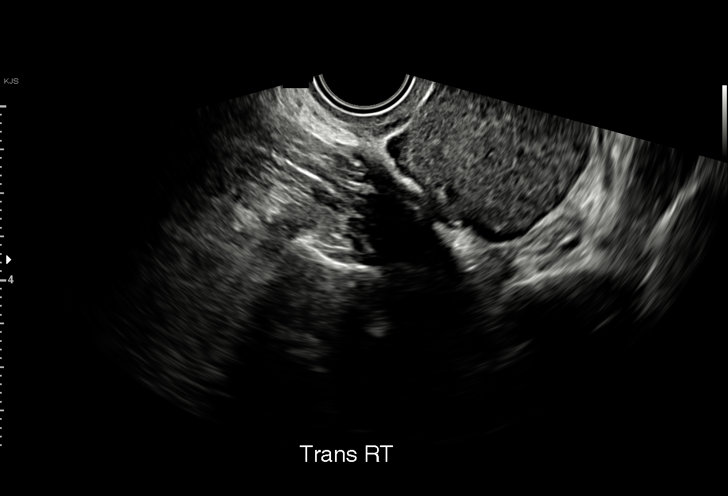
[im 68/88]
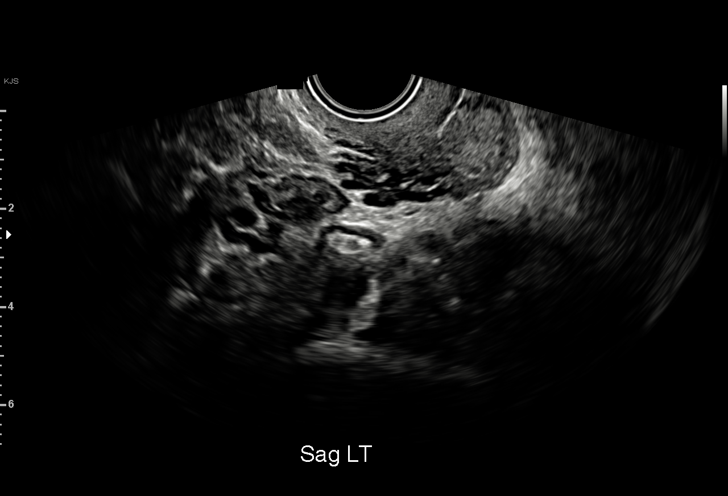
[im 75/88]
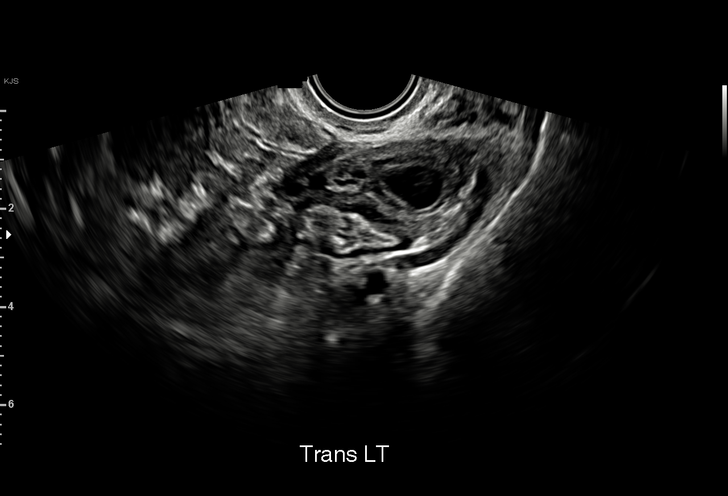
[im 81/88]
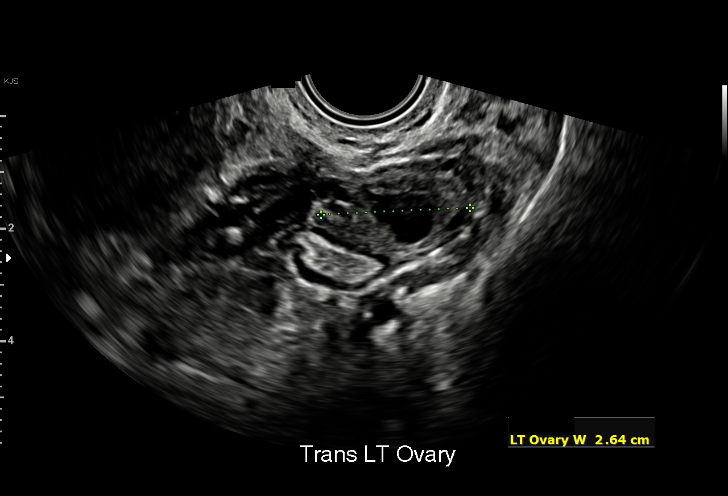
[im 88/88]
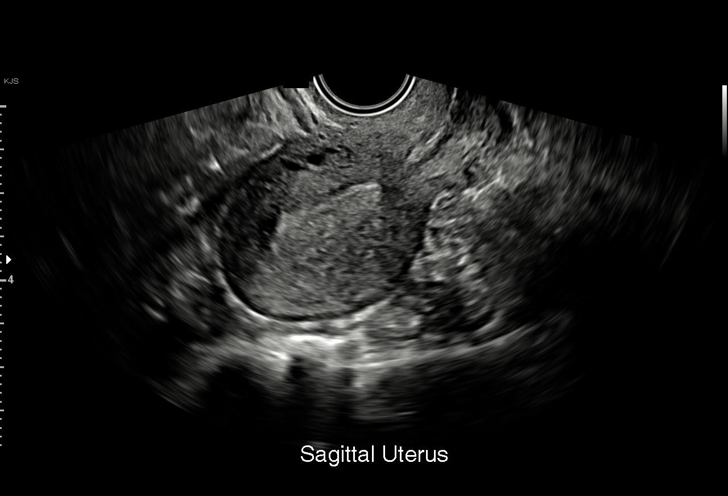

[15 of 28 positions shown; findings below may reference images not displayed]

FINDINGS: Intrauterine gestational sac: None

Maternal uterus/adnexae: Both ovaries are normal in appearance. No
mass or abnormal free fluid identified.
IMPRESSION: Pregnancy of unknown anatomic location (no intrauterine gestational
sac or adnexal mass identified). Differential diagnosis includes
recent spontaneous abortion, IUP too early to visualize, and
non-visualized ectopic pregnancy. Recommend correlation with serial
beta-hCG levels, and follow up US if warranted clinically.

## 2023-06-17 ENCOUNTER — Inpatient Hospital Stay (HOSPITAL_COMMUNITY)
Admission: AD | Admit: 2023-06-17 | Discharge: 2023-06-18 | Disposition: A | Discharge status: Home / Self Care | Payer: Medicaid Other | Attending: Family Medicine | Admitting: Family Medicine

## 2023-06-17 ENCOUNTER — Encounter (HOSPITAL_COMMUNITY): Payer: Self-pay | Admitting: Family Medicine

## 2023-06-17 ENCOUNTER — Other Ambulatory Visit: Payer: Self-pay

## 2023-06-17 DIAGNOSIS — O26893 Other specified pregnancy related conditions, third trimester: Secondary | ICD-10-CM | POA: Insufficient documentation

## 2023-06-17 DIAGNOSIS — O479 False labor, unspecified: Secondary | ICD-10-CM | POA: Diagnosis not present

## 2023-06-17 DIAGNOSIS — O4703 False labor before 37 completed weeks of gestation, third trimester: Secondary | ICD-10-CM | POA: Diagnosis not present

## 2023-06-17 DIAGNOSIS — Z3A34 34 weeks gestation of pregnancy: Secondary | ICD-10-CM | POA: Insufficient documentation

## 2023-06-17 HISTORY — DX: Anemia, unspecified: D64.9

## 2023-06-17 LAB — WET PREP, GENITAL
Clue Cells Wet Prep HPF POC: NONE SEEN
Sperm: NONE SEEN
Trich, Wet Prep: NONE SEEN
WBC, Wet Prep HPF POC: <10 (ref <10)
Yeast Wet Prep HPF POC: NONE SEEN

## 2023-06-17 MED ORDER — CYCLOBENZAPRINE HCL 5 MG PO TABS
5.0000 mg | ORAL_TABLET | Freq: Once | ORAL | Status: AC
  Administered 2023-06-17: 5 mg via ORAL
  Filled 2023-06-17: qty 1

## 2023-06-17 MED ORDER — LACTATED RINGERS IV BOLUS
1000.0000 mL | Freq: Once | INTRAVENOUS | Status: AC
  Administered 2023-06-17: 1000 mL via INTRAVENOUS

## 2023-06-17 MED ORDER — NIFEDIPINE 10 MG PO CAPS
10.0000 mg | ORAL_CAPSULE | ORAL | Status: AC | PRN
  Administered 2023-06-17 (×3): 10 mg via ORAL
  Filled 2023-06-17 (×3): qty 1

## 2023-06-17 MED ORDER — TERBUTALINE SULFATE 1 MG/ML IJ SOLN
0.2500 mg | Freq: Once | INTRAMUSCULAR | Status: AC
Start: 2023-06-17 — End: 2023-06-17
  Administered 2023-06-17: 0.25 mg via SUBCUTANEOUS
  Filled 2023-06-17: qty 1

## 2023-06-17 NOTE — Discharge Instructions (Signed)
Reasons to return to MAU at [facility]:  Since you are preterm, return to MAU if:  1.  Contractions are 10 minutes apart or less and they becoming more uncomfortable or painful over time 2.  You have a large gush of fluid, or a trickle of fluid that will not stop and you have to wear a pad 3.  You have bleeding that is bright red, heavier than spotting--like menstrual bleeding (spotting can be normal in early labor or after a check of your cervix) 4.  You do not feel the baby moving like he/she normally does  

## 2023-06-17 NOTE — MAU Note (Signed)
.  Sherri Young is a 31 y.o. at [redacted]w[redacted]d here in MAU reporting: contractions  Onset of complaint: Contractions started today at 1445, contractions have been consistent and getting more painful.   Pain score: 6 out of 10 during a contraction Vitals:   06/17/23 1844 06/17/23 1846  BP:    Pulse:    Resp:    Temp:    SpO2: 99% 99%     FHT:135

## 2023-06-17 NOTE — MAU Provider Note (Cosign Needed Addendum)
History     CSN: [id number]  Arrival date and time: 06/17/23 1813   None     Chief Complaint  Patient presents with   Contractions    Contractions started at 1445 today, consistent and getting worse   Patient presenting for evaluation for sudden onset contractions which are extremely strong and painful.  Reports that this started today and are approximately every 15 minutes to go through periods when they are closer to yellow.  She reports that she is drinking plenty of fluids.  Denies any vaginal bleeding, gush of fluid.  Reports good fetal movement.  Patient reports that she lives in [place] drove here from lunch and the contractions started.  Sexual intercourse yesterday.    OB History     Gravida  4   Para  1   Term  1   Preterm      AB      Living  1      SAB      IAB      Ectopic      Multiple  0   Live Births  1           Past Medical History:  Diagnosis Date   Anemia     History reviewed. No pertinent surgical history.  History reviewed. No pertinent family history.  Social History   Tobacco Use   Smoking status: Never   Smokeless tobacco: Never  Vaping Use   Vaping Use: Never used  Substance Use Topics   Alcohol use: Not Currently   Drug use: No    Allergies:  Allergies  Allergen Reactions   Watermelon [Citrullus Vulgaris] Itching    ALL MELONS    Medications Prior to Admission  Medication Sig Dispense Refill Last Dose   Prenatal Vit-Fe Fumarate-FA (PRENATAL MULTIVITAMIN) TABS tablet Take 1 tablet by mouth daily at 12 noon.   06/17/2023   docusate sodium (COLACE) 100 MG capsule Take 1 capsule (100 mg total) by mouth 2 (two) times daily. 60 capsule 0 More than a month    Review of Systems  Constitutional:  Negative for chills and fever.  HENT:  Negative for congestion and rhinorrhea.   Respiratory:  Negative for shortness of breath.   Cardiovascular:  Negative for chest pain.  Gastrointestinal:  Positive for abdominal  pain. Negative for constipation, diarrhea, nausea and vomiting.  Endocrine: Negative for polyuria.  Genitourinary:  Positive for pelvic pain. Negative for dysuria, vaginal bleeding and vaginal discharge.  Musculoskeletal:  Positive for back pain.  Neurological:  Negative for headaches.   Physical Exam   Blood pressure 125/83, pulse 92, temperature 98.6 F (37 C), temperature source Oral, resp. rate 19, height 5\' 2"  (1.575 m), weight 88.5 kg, SpO2 99 %, unknown if currently breastfeeding.  Physical Exam Vitals and nursing note reviewed. Exam conducted with a chaperone present.  Constitutional:      General: She is in acute distress.  HENT:     Head: Normocephalic and atraumatic.     Mouth/Throat:     Mouth: Mucous membranes are moist.  Eyes:     Extraocular Movements: Extraocular movements intact.  Cardiovascular:     Rate and Rhythm: Normal rate.  Pulmonary:     Effort: Pulmonary effort is normal.  Abdominal:     Comments: Gravid  Genitourinary:    Comments: Cervix 1/thick/high.  Vaginal bleeding appreciated.  Chaperone present. Musculoskeletal:        General: Normal range of motion.  Skin:  General: Skin is warm and dry.     Capillary Refill: Capillary refill takes less than 2 seconds.  Neurological:     General: No focal deficit present.     Mental Status: She is alert.  Psychiatric:        Mood and Affect: Mood normal.     MAU Course  Procedures  MDM SVD Wet prep Procardia Fluid bolus   Assessment and Plan  Sherri Young [place] a 32 yo G4P1 presenting for preterm labor @ [redacted]w[redacted]d.  Preterm labor Contractions initially started every 15 minutes but have progressively gotten closer together.  Back pain.  Denies any gush of fluid, bleeding, reports good fetal movement.  Initial SVE 1/thick/high.  Patient given 3 doses of Procardia with minimal improvement of contractions.  Wet prep within normal limits.  Patient given fluids without improvement.  Patient handed  off to oncoming nighttime provider for continued monitoring.  Celedonio Savage 06/17/2023, 9:25 PM   MDM:  Cervix unchanged in 2+ hours in MAU but pt continues to have painful contractions and reports they are worsening after Procardia doses x 3.  IV fluids delayed with difficulty getting IV access so plan to finish 1000 ml LR and reevaluate.  If contractions not improved, will give terbutaline.    IV fluids given, terbutaline given. Contractions less frequent on toco, pt still reports painful contractions. Cervix unchanged in 4+ hours in MAU.  No  evidence of preterm labor.  UA not sent so will send send urine culture.  D/C home with PTL precautions.  Pt is out of town but will go home to friend's house, is able to take warm bath/shower, rest.  Rx for Procardia 10 mg Q 6 hours PRN. Pt wants to drive back home to [facility] tomorrow if possible.  Precautions/reasons to return to MAU reviewed.     A/P:  1. Uterine contractions   2. Threatened preterm labor, third trimester   3. [redacted] weeks gestation of pregnancy      D/C home with PTL precautions  Sharen Counter, CNM 11:55 PM

## 2023-06-18 LAB — URINALYSIS, ROUTINE W REFLEX MICROSCOPIC
Glucose, UA: NEGATIVE mg/dL
Hgb urine dipstick: NEGATIVE
Ketones, ur: 5 mg/dL — AB
Leukocytes,Ua: NEGATIVE
Nitrite: NEGATIVE
Protein, ur: 30 mg/dL — AB
Specific Gravity, Urine: 1.03 (ref 1.005–1.030)
pH: 5 (ref 5.0–8.0)

## 2023-06-18 LAB — CULTURE, OB URINE: Culture: 40000 — AB

## 2023-06-19 ENCOUNTER — Other Ambulatory Visit (HOSPITAL_BASED_OUTPATIENT_CLINIC_OR_DEPARTMENT_OTHER): Payer: Self-pay | Admitting: Advanced Practice Midwife

## 2023-06-19 DIAGNOSIS — B951 Streptococcus, group B, as the cause of diseases classified elsewhere: Secondary | ICD-10-CM | POA: Insufficient documentation

## 2023-06-19 MED ORDER — CEFADROXIL 500 MG PO CAPS: 500.0000 mg | ORAL_CAPSULE | Freq: Two times a day (BID) | ORAL | 0 refills | Status: AC

## 2023-06-20 LAB — GC/CHLAMYDIA PROBE AMP (~~LOC~~) NOT AT ARMC
Chlamydia: NEGATIVE
Comment: NEGATIVE
Comment: NORMAL
Neisseria Gonorrhea: NEGATIVE
